# Patient Record
Sex: Female | Born: 1970
Health system: Southern US, Community
[De-identification: ages and names within clinical notes are randomized; demographics above are authoritative.]

## PROBLEM LIST (undated history)

## (undated) DIAGNOSIS — G4700 Insomnia, unspecified: Secondary | ICD-10-CM

## (undated) HISTORY — PX: LEG SURGERY: SHX1003

## (undated) HISTORY — PX: ABDOMINAL HYSTERECTOMY: SHX81

## (undated) HISTORY — DX: Insomnia, unspecified: G47.00

---

## 2008-08-31 HISTORY — PX: BREAST EXCISIONAL BIOPSY: SUR124

## 2009-05-21 ENCOUNTER — Ambulatory Visit: Payer: Self-pay

## 2009-05-23 ENCOUNTER — Ambulatory Visit: Payer: Self-pay

## 2009-06-20 ENCOUNTER — Ambulatory Visit: Payer: Self-pay | Admitting: General Surgery

## 2009-07-11 ENCOUNTER — Ambulatory Visit: Payer: Self-pay | Admitting: General Surgery

## 2010-01-08 ENCOUNTER — Ambulatory Visit: Payer: Self-pay | Admitting: General Surgery

## 2010-07-28 ENCOUNTER — Ambulatory Visit: Payer: Self-pay

## 2011-08-06 ENCOUNTER — Ambulatory Visit: Payer: Self-pay

## 2012-08-16 ENCOUNTER — Ambulatory Visit: Payer: Self-pay

## 2012-08-17 ENCOUNTER — Ambulatory Visit: Payer: Self-pay

## 2013-02-16 ENCOUNTER — Ambulatory Visit: Payer: Self-pay

## 2013-08-14 ENCOUNTER — Ambulatory Visit: Payer: Self-pay | Admitting: Obstetrics and Gynecology

## 2013-08-14 LAB — CBC
HCT: 43.1 % (ref 35.0–47.0)
HGB: 14.3 g/dL (ref 12.0–16.0)
MCH: 28.4 pg (ref 26.0–34.0)
MCHC: 33.3 g/dL (ref 32.0–36.0)
Platelet: 220 10*3/uL (ref 150–440)
RDW: 12.6 % (ref 11.5–14.5)
WBC: 6.8 10*3/uL (ref 3.6–11.0)

## 2013-08-14 LAB — PREGNANCY, URINE: Pregnancy Test, Urine: NEGATIVE m[IU]/mL

## 2013-08-25 ENCOUNTER — Ambulatory Visit: Payer: Self-pay | Admitting: Obstetrics and Gynecology

## 2013-08-28 LAB — PATHOLOGY REPORT

## 2013-09-25 ENCOUNTER — Ambulatory Visit: Payer: Self-pay | Admitting: Obstetrics and Gynecology

## 2013-09-25 LAB — CBC
HCT: 40.2 % (ref 35.0–47.0)
HGB: 13.5 g/dL (ref 12.0–16.0)
MCH: 28.7 pg (ref 26.0–34.0)
MCHC: 33.5 g/dL (ref 32.0–36.0)
MCV: 86 fL (ref 80–100)
Platelet: 209 10*3/uL (ref 150–440)
RBC: 4.69 10*6/uL (ref 3.80–5.20)
RDW: 13 % (ref 11.5–14.5)
WBC: 7.3 10*3/uL (ref 3.6–11.0)

## 2013-09-25 LAB — BASIC METABOLIC PANEL
Anion Gap: 3 — ABNORMAL LOW (ref 7–16)
BUN: 12 mg/dL (ref 7–18)
CALCIUM: 9.3 mg/dL (ref 8.5–10.1)
Chloride: 102 mmol/L (ref 98–107)
Co2: 29 mmol/L (ref 21–32)
Creatinine: 0.6 mg/dL (ref 0.60–1.30)
EGFR (African American): >60
EGFR (Non-African Amer.): >60
GLUCOSE: 86 mg/dL (ref 65–99)
Osmolality: 267 (ref 275–301)
Potassium: 4.1 mmol/L (ref 3.5–5.1)
SODIUM: 134 mmol/L — AB (ref 136–145)

## 2013-10-05 ENCOUNTER — Ambulatory Visit: Payer: Self-pay | Admitting: Obstetrics and Gynecology

## 2013-10-06 LAB — CBC WITH DIFFERENTIAL/PLATELET
BASOS ABS: 0 10*3/uL (ref 0.0–0.1)
BASOS PCT: 0.3 %
Eosinophil #: 0 10*3/uL (ref 0.0–0.7)
Eosinophil %: 0 %
HCT: 34 % — ABNORMAL LOW (ref 35.0–47.0)
HGB: 11.7 g/dL — ABNORMAL LOW (ref 12.0–16.0)
LYMPHS ABS: 1.7 10*3/uL (ref 1.0–3.6)
Lymphocyte %: 15.5 %
MCH: 29.5 pg (ref 26.0–34.0)
MCHC: 34.4 g/dL (ref 32.0–36.0)
MCV: 86 fL (ref 80–100)
MONO ABS: 0.7 x10 3/mm (ref 0.2–0.9)
MONOS PCT: 6.6 %
NEUTROS ABS: 8.8 10*3/uL — AB (ref 1.4–6.5)
Neutrophil %: 77.6 %
PLATELETS: 238 10*3/uL (ref 150–440)
RBC: 3.96 10*6/uL (ref 3.80–5.20)
RDW: 12.9 % (ref 11.5–14.5)
WBC: 11.3 10*3/uL — ABNORMAL HIGH (ref 3.6–11.0)

## 2013-10-06 LAB — BASIC METABOLIC PANEL
Anion Gap: 3 — ABNORMAL LOW (ref 7–16)
BUN: 8 mg/dL (ref 7–18)
CO2: 28 mmol/L (ref 21–32)
Calcium, Total: 7.9 mg/dL — ABNORMAL LOW (ref 8.5–10.1)
Chloride: 107 mmol/L (ref 98–107)
Creatinine: 0.76 mg/dL (ref 0.60–1.30)
EGFR (African American): >60
GLUCOSE: 141 mg/dL — AB (ref 65–99)
Osmolality: 276 (ref 275–301)
Potassium: 4 mmol/L (ref 3.5–5.1)
SODIUM: 138 mmol/L (ref 136–145)

## 2013-10-09 LAB — PATHOLOGY REPORT

## 2014-06-21 ENCOUNTER — Ambulatory Visit: Payer: Self-pay

## 2014-12-22 NOTE — Op Note (Signed)
PATIENT NAME:  Danielle Greene, Danielle Greene MR#:  [id number] DATE OF BIRTH:  1971/08/23  DATE OF PROCEDURE:  10/05/2013  PREOPERATIVE DIAGNOSIS: Menorrhagia, leiomyomata.   POSTOPERATIVE DIAGNOSIS: Menorrhagia, leiomyomata.   OPERATION PERFORMED: Total laparoscopic hysterectomy, bilateral salpingectomy and cystoscopy.   SURGEON: Vena Austria, MD  ASSISTANT: Adria Devon, MD  ANESTHESIA USED: General.  ESTIMATED BLOOD LOSS: 50 mL.  OPERATIVE FLUIDS: 1200 mL.   URINE OUTPUT: 300 mL.  PREOPERATIVE ANTIBIOTICS: 2 grams of Ancef.   DRAINS OR TUBES: Foley to gravity drainage.   IMPLANTS: None.   COMPLICATIONS: None.   INTRAOPERATIVE FINDINGS: Irregular shaped globular uterus with multiple fibroids. Normal ovaries bilateral. Right ovary somewhat adhesed to the right pelvic sidewall. Normal fallopian tubes. Otherwise normal pelvic anatomy. Bilateral ureters were visualized during the case on cystoscopy. 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 The bladder was without defect.   SPECIMENS REMOVED: Uterus and cervix, right and left tube.   PATIENT CONDITION FOLLOWING PROCEDURE: Stable.   PROCEDURE IN DETAIL: Risks, benefits, and alternatives of the procedure were discussed with the patient prior to proceeding to the operating room. The patient was taken to the operating room where she was placed under general endotracheal anesthesia. She was positioned in the dorsal lithotomy position using Allen stirrups, prepped and draped in the usual sterile fashion. A timeout procedure was then performed. Attention was turned to the patient's pelvis. A Foley catheter was placed to allow drainage of the bladder. An operative speculum was then placed in the patient's vagina to visualize the cervix. The anterior lip of the cervix was grasped with a single-tooth tenaculum and a large V-Care device was  placed to allow for uterine manipulation. Following placement of the V-Care device, the single-tooth tenaculum was removed as was the operative speculum. Attention was then turned to the patient's abdomen. The umbilicus was infiltrated with 1% lidocaine. A stab incision was made at the base of the umbilicus and entry into the abdomen was accomplished using a 5 mm XL trocar under direct visualization. Once pneumoperitoneum had been established, two 5 mm lateral assistant ports, 1 right and 1 left, were placed under direct visualization. Survey of the pelvis noted the above findings. Attention was turned to the patient's left tube. This was dissected off its attachments to the ovary and mesosalpinx using a 5 mm Harmonic device. Following this the utero-ovarian pedicle was transected using the 5 mm Harmonic followed by the round. Once the round ligament had been transected, the anterior leaf of the broad was dissected down to the level of the internal cervical os. The posterior leaf of the broad ligament was then dissected down to the level of the left uterosacral. The left uterine artery was skeletonized and ligated using a Kleppinger bipolar device and transected using the 5 mm LigaSure. Following this attention was turned to the patient's left adnexal structures. The tube was dissected off its attachments to the mesosalpinx in a similar fashion. The utero-ovarian and round ligaments were also transected using the 5 mm LigaSure device. The anterior leaf and posterior leaf of the broad were likewise skeletonized, and the uterine artery was further skeletonized before using the Kleppinger and Harmonic to dissect the uterine artery on the right. The bladder was noted to be well down away from the V-Care cup. A posterior colpotomy was then made using the 5 mm LigaSure, carried down circumferentially freeing the specimen.  The specimen was removed vaginally, and the vaginal cuff was then closed using a single 0 Vicryl  running stitch. Following closure of the vaginal cuff, no defects were noted in the vaginal cuff. Cystoscopy was performed noting the above findings. A second look was taken from above. All pedicles were noted to be hemostatic. The pelvis was irrigated, and the pneumoperitoneum was evacuated. The 5 mm port sites were closed using Dermabond. Sponge, needle, and instrument counts were correct x2. The patient tolerated the procedure well and was taken to the recovery room in stable condition.   ____________________________ Florina OuAndreas M. Bonney AidStaebler, MD ams:sb D: 10/05/2013 16:21:31 ET T: 10/05/2013 17:17:23 ET JOB#: [id number]  cc: Florina OuAndreas M. Bonney AidStaebler, MD, <Dictator> Carmel SacramentoANDREAS Cathrine MusterM Maribell Demeo MD ELECTRONICALLY SIGNED 10/18/2013 0:59

## 2014-12-22 NOTE — Op Note (Signed)
PATIENT NAME:  Danielle Greene, Danielle Greene MR#:  [id number] DATE OF BIRTH:  [birth year]  DATE OF PROCEDURE:  08/25/2013  PREOPERATIVE DIAGNOSES: Menorrhagia and endometrial polyps.   POSTOPERATIVE DIAGNOSES: Menorrhagia and endometrial polyps including several mucosal leiomyomas.  OPERATION PERFORMED: Hysteroscopy, dilatation and curettage.   ANESTHESIA: General.  SURGEON: Vena AustriaAndreas Nuri Branca, MD.   ESTIMATED BLOOD LOSS: Minimal.  OPERATIVE FLUIDS: 300 mL.  URINE OUTPUT: 100 mL.   PREOPERATIVE ANTIBIOTICS: None.  DRAINS OR TUBES: None.   IMPLANTS: None.  FINDINGS: Several small submucosal fibroids, 1 small polyp in the uterine fundus, otherwise normal intracavitary anatomy and cervix.   SPECIMENS REMOVED: Endometrial curettings.   CONDITION FOLLOWING PROCEDURE: Stable.   PROCEDURE IN DETAIL: Risks, benefits and alternatives of the procedure were discussed with the patient prior to proceeding to the operating room. The patient was taken to the operating room where she was placed under general endotracheal anesthesia. The patient was positioned in the dorsal lithotomy position utilizing Allen stirrups. She was prepped and draped in the usual sterile fashion. A timeout procedure was performed. Attention was turned to the patient's pelvis and the operative speculum was placed. The anterior lip of the cervix was grasped with a single-tooth tenaculum and was sequentially dilated using Pratt dilators. The hysteroscope was then advanced noting the above findings. Sharp curettage was performed and following the curettage a second look hysteroscopy confirmed removal of the polyp. The hysteroscope was removed as was the single-tooth tenaculum and operative speculum. Sponge, needle and instrument counts were correct x 2. The patient tolerated the procedure well and was taken to the recovery room in stable condition.   ____________________________ Florina OuAndreas M. Bonney AidStaebler, MD ams:aw D: 08/27/2013 22:24:04  ET T: 08/28/2013 06:36:08 ET JOB#: [id number]  cc: Florina OuAndreas M. Bonney AidStaebler, MD, <Dictator> Carmel SacramentoANDREAS Cathrine MusterM [place] MD ELECTRONICALLY SIGNED 09/21/2013 8:51

## 2015-06-20 ENCOUNTER — Other Ambulatory Visit: Payer: Self-pay | Admitting: Obstetrics and Gynecology

## 2015-06-20 DIAGNOSIS — Z1231 Encounter for screening mammogram for malignant neoplasm of breast: Secondary | ICD-10-CM

## 2015-06-24 ENCOUNTER — Ambulatory Visit
Admission: RE | Admit: 2015-06-24 | Discharge: 2015-06-24 | Disposition: A | Discharge status: Home / Self Care | Payer: BLUE CROSS/BLUE SHIELD | Source: Ambulatory Visit | Attending: Obstetrics and Gynecology | Admitting: Obstetrics and Gynecology

## 2015-06-24 DIAGNOSIS — Z1231 Encounter for screening mammogram for malignant neoplasm of breast: Secondary | ICD-10-CM

## 2015-12-16 DIAGNOSIS — M546 Pain in thoracic spine: Secondary | ICD-10-CM | POA: Diagnosis not present

## 2015-12-16 DIAGNOSIS — M9902 Segmental and somatic dysfunction of thoracic region: Secondary | ICD-10-CM | POA: Diagnosis not present

## 2015-12-30 DIAGNOSIS — M9907 Segmental and somatic dysfunction of upper extremity: Secondary | ICD-10-CM | POA: Diagnosis not present

## 2015-12-30 DIAGNOSIS — M546 Pain in thoracic spine: Secondary | ICD-10-CM | POA: Diagnosis not present

## 2015-12-30 DIAGNOSIS — M9903 Segmental and somatic dysfunction of lumbar region: Secondary | ICD-10-CM | POA: Diagnosis not present

## 2015-12-30 DIAGNOSIS — M9902 Segmental and somatic dysfunction of thoracic region: Secondary | ICD-10-CM | POA: Diagnosis not present

## 2016-01-14 DIAGNOSIS — M9903 Segmental and somatic dysfunction of lumbar region: Secondary | ICD-10-CM | POA: Diagnosis not present

## 2016-01-14 DIAGNOSIS — M9907 Segmental and somatic dysfunction of upper extremity: Secondary | ICD-10-CM | POA: Diagnosis not present

## 2016-01-14 DIAGNOSIS — M9902 Segmental and somatic dysfunction of thoracic region: Secondary | ICD-10-CM | POA: Diagnosis not present

## 2016-01-14 DIAGNOSIS — M546 Pain in thoracic spine: Secondary | ICD-10-CM | POA: Diagnosis not present

## 2016-03-04 DIAGNOSIS — Z1283 Encounter for screening for malignant neoplasm of skin: Secondary | ICD-10-CM | POA: Diagnosis not present

## 2016-03-04 DIAGNOSIS — Z872 Personal history of diseases of the skin and subcutaneous tissue: Secondary | ICD-10-CM | POA: Diagnosis not present

## 2016-03-04 DIAGNOSIS — L301 Dyshidrosis [pompholyx]: Secondary | ICD-10-CM | POA: Diagnosis not present

## 2016-03-30 DIAGNOSIS — M9903 Segmental and somatic dysfunction of lumbar region: Secondary | ICD-10-CM | POA: Diagnosis not present

## 2016-03-30 DIAGNOSIS — M546 Pain in thoracic spine: Secondary | ICD-10-CM | POA: Diagnosis not present

## 2016-03-30 DIAGNOSIS — M9902 Segmental and somatic dysfunction of thoracic region: Secondary | ICD-10-CM | POA: Diagnosis not present

## 2016-06-03 ENCOUNTER — Other Ambulatory Visit: Payer: Self-pay | Admitting: Obstetrics and Gynecology

## 2016-06-03 DIAGNOSIS — Z1231 Encounter for screening mammogram for malignant neoplasm of breast: Secondary | ICD-10-CM

## 2016-06-04 DIAGNOSIS — Z23 Encounter for immunization: Secondary | ICD-10-CM | POA: Diagnosis not present

## 2016-06-30 ENCOUNTER — Ambulatory Visit
Admission: RE | Admit: 2016-06-30 | Discharge: 2016-06-30 | Disposition: A | Discharge status: Home / Self Care | Payer: BLUE CROSS/BLUE SHIELD | Source: Ambulatory Visit | Attending: Obstetrics and Gynecology | Admitting: Obstetrics and Gynecology

## 2016-06-30 ENCOUNTER — Other Ambulatory Visit: Payer: Self-pay | Admitting: Obstetrics and Gynecology

## 2016-06-30 DIAGNOSIS — Z1231 Encounter for screening mammogram for malignant neoplasm of breast: Secondary | ICD-10-CM | POA: Diagnosis not present

## 2016-08-14 ENCOUNTER — Other Ambulatory Visit: Payer: Self-pay | Admitting: Obstetrics and Gynecology

## 2016-08-14 ENCOUNTER — Ambulatory Visit (INDEPENDENT_AMBULATORY_CARE_PROVIDER_SITE_OTHER): Payer: BLUE CROSS/BLUE SHIELD | Admitting: Obstetrics and Gynecology

## 2016-08-14 ENCOUNTER — Encounter: Payer: Self-pay | Admitting: Obstetrics and Gynecology

## 2016-08-14 VITALS — BP 128/67 | HR 56 | Ht 63.0 in | Wt 151.2 lb

## 2016-08-14 DIAGNOSIS — Z23 Encounter for immunization: Secondary | ICD-10-CM | POA: Diagnosis not present

## 2016-08-14 DIAGNOSIS — Z01419 Encounter for gynecological examination (general) (routine) without abnormal findings: Secondary | ICD-10-CM

## 2016-08-14 MED ORDER — ZOLPIDEM TARTRATE 10 MG PO TABS: 10.0000 mg | ORAL_TABLET | Freq: Every evening | ORAL | 2 refills | Status: DC | PRN

## 2016-08-14 MED ORDER — TETANUS-DIPHTH-ACELL PERTUSSIS 5-2.5-18.5 LF-MCG/0.5 IM SUSP
0.5000 mL | Freq: Once | INTRAMUSCULAR | Status: AC
  Administered 2016-08-14: 0.5 mL via INTRAMUSCULAR

## 2016-08-14 NOTE — Progress Notes (Signed)
Subjective:   Shravya L Aurea GraffStraughan is a 45 y.o. G1P0 Caucasian female here for a routine well-woman exam.  Patient's last menstrual period was 10/24/2013.    Current complaints: none PCP: none       does desire labs  Social History: Sexual: heterosexual Marital Status: married Living situation: with family Occupation: [employer] Tobacco/alcohol: no tobacco use Illicit drugs: no history of illicit drug use  The following portions of the patient's history were reviewed and updated as appropriate: allergies, current medications, past family history, past medical history, past social history, past surgical history and problem list.  Past Medical History Past Medical History:  Diagnosis Date  . Insomnia     Past Surgical History Past Surgical History:  Procedure Laterality Date  . BREAST BIOPSY Right 2010   neg    Gynecologic History G1P0  Patient's last menstrual period was 10/24/2013. Contraception: status post hysterectomy Last Pap: 2016. Results were: normal Last mammogram: 2017. Results were: normal   Obstetric History OB History  Gravida Para Term Preterm AB Living  1            SAB TAB Ectopic Multiple Live Births               # Outcome Date GA Lbr Len/2nd Weight Sex Delivery Anes PTL Lv  1 Gravida 2000    M Vag-Spont         Current Medications No current outpatient prescriptions on file prior to visit.   No current facility-administered medications on file prior to visit.     Review of Systems Patient denies any headaches, blurred vision, shortness of breath, chest pain, abdominal pain, problems with bowel movements, urination, or intercourse.  Objective:  BP 128/67   Pulse (!) 56   Ht 5\' 3"  (1.6 m)   Wt 151 lb 3.2 oz (68.6 kg)   LMP 10/24/2013   BMI 26.78 kg/m  Physical Exam  General:  Well developed, well nourished, no acute distress. She is alert and oriented x3. Skin:  Warm and dry Neck:  Midline trachea, no thyromegaly or  nodules Cardiovascular: Regular rate and rhythm, no murmur heard Lungs:  Effort normal, all lung fields clear to auscultation bilaterally Breasts:  No dominant palpable mass, retraction, or nipple discharge Abdomen:  Soft, non tender, no hepatosplenomegaly or masses Pelvic:  External genitalia is normal in appearance.  The vagina is normal in appearance. The cervix is bulbous, no CMT.  Thin prep pap is done with HR HPV cotesting. Uterus is felt to be normal size, shape, and contour.  No adnexal masses or tenderness noted. Extremities:  No swelling or varicosities noted Psych:  She has a normal mood and affect  Assessment:   Healthy well-woman exam Sleep disturbance Needs TDAP  Plan:  Labs obtained, Tdap given F/U 1 year for AE, or sooner if needed Mammogram done this year  Cora Stetson Suzan NailerN Geneieve Duell, CNM

## 2016-08-14 NOTE — Patient Instructions (Signed)
 Preventive Care 18-39 Years, Female Preventive care refers to lifestyle choices and visits with your health care provider that can promote health and wellness. What does preventive care include?  A yearly physical exam. This is also called an annual well check.  Dental exams once or twice a year.  Routine eye exams. Ask your health care provider how often you should have your eyes checked.  Personal lifestyle choices, including:  Daily care of your teeth and gums.  Regular physical activity.  Eating a healthy diet.  Avoiding tobacco and drug use.  Limiting alcohol use.  Practicing safe sex.  Taking vitamin and mineral supplements as recommended by your health care provider. What happens during an annual well check? The services and screenings done by your health care provider during your annual well check will depend on your age, overall health, lifestyle risk factors, and family history of disease. Counseling  Your health care provider may ask you questions about your:  Alcohol use.  Tobacco use.  Drug use.  Emotional well-being.  Home and relationship well-being.  Sexual activity.  Eating habits.  Work and work environment.  Method of birth control.  Menstrual cycle.  Pregnancy history. Screening  You may have the following tests or measurements:  Height, weight, and BMI.  Diabetes screening. This is done by checking your blood sugar (glucose) after you have not eaten for a while (fasting).  Blood pressure.  Lipid and cholesterol levels. These may be checked every 5 years starting at age 20.  Skin check.  Hepatitis C blood test.  Hepatitis B blood test.  Sexually transmitted disease (STD) testing.  BRCA-related cancer screening. This may be done if you have a family history of breast, ovarian, tubal, or peritoneal cancers.  Pelvic exam and Pap test. This may be done every 3 years starting at age 21. Starting at age 30, this may be done  every 5 years if you have a Pap test in combination with an HPV test. Discuss your test results, treatment options, and if necessary, the need for more tests with your health care provider. Vaccines  Your health care provider may recommend certain vaccines, such as:  Influenza vaccine. This is recommended every year.  Tetanus, diphtheria, and acellular pertussis (Tdap, Td) vaccine. You may need a Td booster every 10 years.  Varicella vaccine. You may need this if you have not been vaccinated.  HPV vaccine. If you are 26 or younger, you may need three doses over 6 months.  Measles, mumps, and rubella (MMR) vaccine. You may need at least one dose of MMR. You may also need a second dose.  Pneumococcal 13-valent conjugate (PCV13) vaccine. You may need this if you have certain conditions and were not previously vaccinated.  Pneumococcal polysaccharide (PPSV23) vaccine. You may need one or two doses if you smoke cigarettes or if you have certain conditions.  Meningococcal vaccine. One dose is recommended if you are age 19-21 years and a first-year college student living in a residence hall, or if you have one of several medical conditions. You may also need additional booster doses.  Hepatitis A vaccine. You may need this if you have certain conditions or if you travel or work in places where you may be exposed to hepatitis A.  Hepatitis B vaccine. You may need this if you have certain conditions or if you travel or work in places where you may be exposed to hepatitis B.  Haemophilus influenzae type b (Hib) vaccine. You may need   this if you have certain risk factors. Talk to your health care provider about which screenings and vaccines you need and how often you need them. This information is not intended to replace advice given to you by your health care provider. Make sure you discuss any questions you have with your health care provider. Document Released: 10/13/2001 Document Revised:  05/06/2016 Document Reviewed: 06/18/2015 Elsevier Interactive Patient Education  2017 Elsevier Inc.  

## 2016-08-15 LAB — COMPREHENSIVE METABOLIC PANEL
ALK PHOS: 36 IU/L — AB (ref 39–117)
ALT: 11 IU/L (ref 0–32)
AST: 15 IU/L (ref 0–40)
Albumin/Globulin Ratio: 1.6 (ref 1.2–2.2)
Albumin: 4.5 g/dL (ref 3.5–5.5)
BILIRUBIN TOTAL: 0.4 mg/dL (ref 0.0–1.2)
BUN/Creatinine Ratio: 21 (ref 9–23)
BUN: 18 mg/dL (ref 6–24)
CHLORIDE: 99 mmol/L (ref 96–106)
CO2: 24 mmol/L (ref 18–29)
CREATININE: 0.85 mg/dL (ref 0.57–1.00)
Calcium: 9.4 mg/dL (ref 8.7–10.2)
GFR calc non Af Amer: 84 mL/min/{1.73_m2} (ref >59)
GFR, EST AFRICAN AMERICAN: 96 mL/min/{1.73_m2} (ref >59)
GLUCOSE: 88 mg/dL (ref 65–99)
Globulin, Total: 2.9 g/dL (ref 1.5–4.5)
Potassium: 4.2 mmol/L (ref 3.5–5.2)
Sodium: 139 mmol/L (ref 134–144)
TOTAL PROTEIN: 7.4 g/dL (ref 6.0–8.5)

## 2016-08-15 LAB — LIPID PANEL
CHOL/HDL RATIO: 3.1 ratio (ref 0.0–4.4)
Cholesterol, Total: 167 mg/dL (ref 100–199)
HDL: 54 mg/dL (ref >39)
LDL Calculated: 96 mg/dL (ref 0–99)
Triglycerides: 85 mg/dL (ref 0–149)
VLDL Cholesterol Cal: 17 mg/dL (ref 5–40)

## 2016-08-15 LAB — VITAMIN D 25 HYDROXY (VIT D DEFICIENCY, FRACTURES): VIT D 25 HYDROXY: 34.9 ng/mL (ref 30.0–100.0)

## 2016-08-17 ENCOUNTER — Other Ambulatory Visit: Payer: Self-pay

## 2016-08-17 LAB — CYTOLOGY - PAP

## 2016-09-24 DIAGNOSIS — M9902 Segmental and somatic dysfunction of thoracic region: Secondary | ICD-10-CM | POA: Diagnosis not present

## 2016-09-24 DIAGNOSIS — M9903 Segmental and somatic dysfunction of lumbar region: Secondary | ICD-10-CM | POA: Diagnosis not present

## 2016-09-24 DIAGNOSIS — M546 Pain in thoracic spine: Secondary | ICD-10-CM | POA: Diagnosis not present

## 2016-10-01 DIAGNOSIS — M9903 Segmental and somatic dysfunction of lumbar region: Secondary | ICD-10-CM | POA: Diagnosis not present

## 2016-10-01 DIAGNOSIS — M546 Pain in thoracic spine: Secondary | ICD-10-CM | POA: Diagnosis not present

## 2016-10-01 DIAGNOSIS — M9902 Segmental and somatic dysfunction of thoracic region: Secondary | ICD-10-CM | POA: Diagnosis not present

## 2016-10-15 DIAGNOSIS — M9903 Segmental and somatic dysfunction of lumbar region: Secondary | ICD-10-CM | POA: Diagnosis not present

## 2016-10-15 DIAGNOSIS — M9902 Segmental and somatic dysfunction of thoracic region: Secondary | ICD-10-CM | POA: Diagnosis not present

## 2016-10-15 DIAGNOSIS — M546 Pain in thoracic spine: Secondary | ICD-10-CM | POA: Diagnosis not present

## 2016-11-05 DIAGNOSIS — M9902 Segmental and somatic dysfunction of thoracic region: Secondary | ICD-10-CM | POA: Diagnosis not present

## 2016-11-05 DIAGNOSIS — M9903 Segmental and somatic dysfunction of lumbar region: Secondary | ICD-10-CM | POA: Diagnosis not present

## 2016-11-05 DIAGNOSIS — M546 Pain in thoracic spine: Secondary | ICD-10-CM | POA: Diagnosis not present

## 2016-11-20 ENCOUNTER — Encounter: Payer: Self-pay | Admitting: Obstetrics and Gynecology

## 2016-12-18 DIAGNOSIS — M9902 Segmental and somatic dysfunction of thoracic region: Secondary | ICD-10-CM | POA: Diagnosis not present

## 2016-12-18 DIAGNOSIS — M9903 Segmental and somatic dysfunction of lumbar region: Secondary | ICD-10-CM | POA: Diagnosis not present

## 2016-12-18 DIAGNOSIS — M546 Pain in thoracic spine: Secondary | ICD-10-CM | POA: Diagnosis not present

## 2017-01-15 DIAGNOSIS — M9902 Segmental and somatic dysfunction of thoracic region: Secondary | ICD-10-CM | POA: Diagnosis not present

## 2017-01-15 DIAGNOSIS — M9903 Segmental and somatic dysfunction of lumbar region: Secondary | ICD-10-CM | POA: Diagnosis not present

## 2017-02-04 ENCOUNTER — Ambulatory Visit (INDEPENDENT_AMBULATORY_CARE_PROVIDER_SITE_OTHER): Payer: BLUE CROSS/BLUE SHIELD | Admitting: Obstetrics and Gynecology

## 2017-02-04 ENCOUNTER — Encounter: Payer: Self-pay | Admitting: Obstetrics and Gynecology

## 2017-02-04 VITALS — BP 124/58 | HR 56 | Ht 63.0 in | Wt 136.8 lb

## 2017-02-04 DIAGNOSIS — N393 Stress incontinence (female) (male): Secondary | ICD-10-CM

## 2017-02-04 NOTE — Patient Instructions (Addendum)
Urinary Incontinence Urinary incontinence is the involuntary loss of urine from your bladder. What are the causes? There are many causes of urinary incontinence. They include:  Medicines.  Infections.  Prostatic enlargement, leading to overflow of urine from your bladder.  Surgery.  Neurological diseases.  Emotional factors.  What are the signs or symptoms? Urinary Incontinence can be divided into four types: 1. Urge incontinence. Urge incontinence is the involuntary loss of urine before you have the opportunity to go to the bathroom. There is a sudden urge to void but not enough time to reach a bathroom. 2. Stress incontinence. Stress incontinence is the sudden loss of urine with any activity that forces urine to pass. It is commonly caused by anatomical changes to the pelvis and sphincter areas of your body. 3. Overflow incontinence. Overflow incontinence is the loss of urine from an obstructed opening to your bladder. This results in a backup of urine and a resultant buildup of pressure within the bladder. When the pressure within the bladder exceeds the closing pressure of the sphincter, the urine overflows, which causes incontinence, similar to water overflowing a dam. 4. Total incontinence. Total incontinence is the loss of urine as a result of the inability to store urine within your bladder.  How is this diagnosed? Evaluating the cause of incontinence may require:  A thorough and complete medical and obstetric history.  A complete physical exam.  Laboratory tests such as a urine culture and sensitivities.  When additional tests are indicated, they can include:  An ultrasound exam.  Kidney and bladder X-rays.  Cystoscopy. This is an exam of the bladder using a narrow scope.  Urodynamic testing to test the nerve function to the bladder and sphincter areas.  How is this treated? Treatment for urinary incontinence depends on the cause:  For urge incontinence caused  by a bacterial infection, antibiotics will be prescribed. If the urge incontinence is related to medicines you take, your health care provider may have you change the medicine.  For stress incontinence, surgery to re-establish anatomical support to the bladder or sphincter, or both, will often correct the condition.  For overflow incontinence caused by an enlarged prostate, an operation to open the channel through the enlarged prostate will allow the flow of urine out of the bladder. In women with fibroids, a hysterectomy may be recommended.  For total incontinence, surgery on your urinary sphincter may help. An artificial urinary sphincter (an inflatable cuff placed around the urethra) may be required. In women who have developed a hole-like passage between their bladder and vagina (vesicovaginal fistula), surgery to close the fistula often is required.  Follow these instructions at home:  Normal daily hygiene and the use of pads or adult diapers that are changed regularly will help prevent odors and skin damage.  Avoid caffeine. It can overstimulate your bladder.  Use the bathroom regularly. Try about every 2-3 hours to go to the bathroom, even if you do not feel the need to do so. Take time to empty your bladder completely. After urinating, wait a minute. Then try to urinate again.  For causes involving nerve dysfunction, keep a log of the medicines you take and a journal of the times you go to the bathroom. Contact a health care provider if:  You experience worsening of pain instead of improvement in pain after your procedure.  Your incontinence becomes worse instead of better. Get help right away if:  You experience fever or shaking chills.  You are unable to   pass your urine.  You have redness spreading into your groin or down into your thighs. This information is not intended to replace advice given to you by your health care provider. Make sure you discuss any questions you have  with your health care provider. Document Released: 09/24/2004 Document Revised: 03/27/2016 Document Reviewed: 01/24/2013 Elsevier Interactive Patient Education  2018 Elsevier Inc.  

## 2017-02-04 NOTE — Progress Notes (Signed)
     Subjective:     Danielle Greene is a 46 y.o. female who was referred by Harlow MaresMelody Shambley, CNM for evaluation of urinary incontinence and pessary consultation. This has been present for ~ 1 year and is progressively worsening. She leaks urine with exercise. Patient describes the symptoms as urine leakage with coughing/heavy physical activity. Factors associated with symptoms include: none known. Evaluation to date includes none. Treatment to date includes none. Has tried the OTC Poise Impressa vaginal inserts with no success.    Past Medical History:  Diagnosis Date  . Insomnia     Family History  Problem Relation Age of Onset  . Dementia Mother   . Breast cancer Neg Hx   . Ovarian cancer Neg Hx     Past Surgical History:  Procedure Laterality Date  . BREAST BIOPSY Right 2010   neg    Social History   Social History  . Marital status: Married    Spouse name: N/A  . Number of children: N/A  . Years of education: N/A   Occupational History  . Not on file.   Social History Main Topics  . Smoking status: Never Smoker  . Smokeless tobacco: Never Used  . Alcohol use Yes  . Drug use: No  . Sexual activity: Yes    Birth control/ protection: Surgical   Other Topics Concern  . Not on file   Social History Narrative  . No narrative on file    No Known Allergies   Review of Systems Pertinent items noted in HPI and remainder of comprehensive ROS otherwise negative.    Objective:    BP (!) 124/58 (BP Location: Left Arm, Patient Position: Sitting, Cuff Size: Normal)   Pulse (!) 56   Ht 5\' 3"  (1.6 m)   Wt 136 lb 12.8 oz (62.1 kg)   LMP 10/24/2013 Comment: Partial hysterectomy   BMI 24.23 kg/m  General appearance: alert and no distress Abdomen: soft, non-tender; bowel sounds normal; no masses,  no organomegaly Pelvic: external genitalia normal, rectovaginal septum normal.  Vagina without discharge.  No cystocele present, Grade 1 rectocele.  No leakage of  urine on cough or Valsalva. Cervix normal appearing, no lesions and no motion tenderness.  Uterus mobile, nontender, normal shape and size.  Adnexae non-palpable, nontender bilaterally.  Extremities: extremities normal, atraumatic, no cyanosis or edema Neurologic: Grossly normal    Assessment:   Stress urinary incontinence (exercise induced)  Plan:    The causes of incontinence and plan for evaluation and treatment were discussed. Appropriate educational materials were distributed. Discussed Kegel exercised in detail. Discussed peassary use.  Pessary fitting today.  Will order size 2 incontinence dish with out support   To return in 2 weeks for insertion.    Danielle Greene, Danielle Vavrek, MD [facility]

## 2017-02-09 ENCOUNTER — Other Ambulatory Visit: Payer: Self-pay | Admitting: *Deleted

## 2017-02-09 MED ORDER — ZOLPIDEM TARTRATE 10 MG PO TABS: 10.0000 mg | ORAL_TABLET | Freq: Every evening | ORAL | 2 refills | Status: DC | PRN

## 2017-02-18 ENCOUNTER — Telehealth: Payer: Self-pay | Admitting: Obstetrics and Gynecology

## 2017-02-18 NOTE — Telephone Encounter (Signed)
Patient called to see if the pessary has come in for her yet  Please call with update

## 2017-02-22 NOTE — Telephone Encounter (Signed)
Called pt informed her that pessary was in, had pt scheduled for 02/23/2017 at 1:45pm.

## 2017-02-23 ENCOUNTER — Ambulatory Visit (INDEPENDENT_AMBULATORY_CARE_PROVIDER_SITE_OTHER): Payer: BLUE CROSS/BLUE SHIELD | Admitting: Obstetrics and Gynecology

## 2017-02-23 VITALS — BP 149/66 | HR 47 | Ht 63.0 in | Wt 137.1 lb

## 2017-02-23 DIAGNOSIS — R1032 Left lower quadrant pain: Secondary | ICD-10-CM | POA: Diagnosis not present

## 2017-02-23 DIAGNOSIS — N393 Stress incontinence (female) (male): Secondary | ICD-10-CM

## 2017-02-23 NOTE — Progress Notes (Signed)
    GYNECOLOGY PROGRESS NOTE  Subjective:    Patient ID: Danielle Greene, female    DOB: 10-23-1970, 46 y.o.   MRN: [id number]  HPI  Patient is a 46 y.o. G1P0 female who presents for pessary insertion for mild stress incontinenence (mostly occurring during exercise or other vigorous activity).   The following portions of the patient's history were reviewed and updated as appropriate: allergies, current medications, past family history, past medical history, past social history, past surgical history and problem list.  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 Notes she is concerned and wants to make sure it is not a blood clot.  Has been using Ibuprofen and ice packs as needed.   Objective:   Last menstrual period 10/24/2013. General appearance: alert and no distress Pelvic: external genitalia normal, rectovaginal septum normal.  Vagina without discharge.  No cystocele present, Grade 1 rectocele.  No leakage of urine on cough or Valsalva.  Bimanual exam not performed.  Extremities: extremities normal, atraumatic, no cyanosis or edema.  Normal pedal and femoral pulses Neurologic: Grossly normal    Assessment:   Stress urinary incontinence (exercise induced) Groin pain  Plan:   Size 2 incontinence dish without support placed today.  Patient will use during days of exercising, and will self-remove when not needed. Patient to f/u in 2 weeks for re-evaluation of symptoms.  Advised on Trimo-San gel with insertion of pessary.  Reassured patient that she did not have a blood clot as pulses normal, no evidence of tenderness, or unilateral edema.   Hildred Laserherry, Rubyann Lingle, MD [facility]

## 2017-03-09 ENCOUNTER — Encounter: Payer: Self-pay | Admitting: Obstetrics and Gynecology

## 2017-03-09 ENCOUNTER — Ambulatory Visit (INDEPENDENT_AMBULATORY_CARE_PROVIDER_SITE_OTHER): Payer: BLUE CROSS/BLUE SHIELD | Admitting: Obstetrics and Gynecology

## 2017-03-09 VITALS — BP 111/53 | HR 53 | Ht 63.0 in | Wt 136.3 lb

## 2017-03-09 DIAGNOSIS — N393 Stress incontinence (female) (male): Secondary | ICD-10-CM

## 2017-03-09 NOTE — Progress Notes (Signed)
    GYNECOLOGY PROGRESS NOTE  Subjective:    Patient ID: Danielle Greene, female    DOB: [birth year], 46 y.o.   MRN: [id number]  HPI  Patient is a 46 y.o. G1P0 female who presents for 2 week f/u of pessary check. Patient notes that she has seen some moderate improvement with her stress incontinence while exercising. Is still having some occasional small amounts of leakage, requiring a pantyliner. Has no difficulty inserting and removing pessary as needed.   The following portions of the patient's history were reviewed and updated as appropriate: allergies, current medications, past family history, past medical history, past social history, past surgical history and problem list.  Review of Systems Pertinent items noted in HPI and remainder of comprehensive ROS otherwise negative.   Objective:   Blood pressure (!) 111/53, pulse (!) 53, height 5\' 3"  (1.6 m), weight 136 lb 4.8 oz (61.8 kg), last menstrual period 10/24/2013. General appearance: alert and no distress Remainder of exam deferred   Assessment:   Stress incontinence (with exercise)  Plan:   Patient has been using the pessary now for ~ 1 week.  Notes moderate improvement. Advised to also continue with Kegel exercises.  Will reassess symptoms in 3-4 months.     Hildred Laserherry, Pinchus Weckwerth, MD [facility]

## 2017-03-10 DIAGNOSIS — Z86018 Personal history of other benign neoplasm: Secondary | ICD-10-CM | POA: Diagnosis not present

## 2017-03-10 DIAGNOSIS — D229 Melanocytic nevi, unspecified: Secondary | ICD-10-CM | POA: Diagnosis not present

## 2017-03-23 DIAGNOSIS — M25852 Other specified joint disorders, left hip: Secondary | ICD-10-CM | POA: Diagnosis not present

## 2017-03-23 DIAGNOSIS — M25552 Pain in left hip: Secondary | ICD-10-CM | POA: Diagnosis not present

## 2017-03-26 DIAGNOSIS — M25552 Pain in left hip: Secondary | ICD-10-CM | POA: Diagnosis not present

## 2017-03-29 DIAGNOSIS — M25552 Pain in left hip: Secondary | ICD-10-CM | POA: Diagnosis not present

## 2017-03-31 DIAGNOSIS — M25552 Pain in left hip: Secondary | ICD-10-CM | POA: Diagnosis not present

## 2017-04-05 DIAGNOSIS — M25552 Pain in left hip: Secondary | ICD-10-CM | POA: Diagnosis not present

## 2017-04-07 DIAGNOSIS — M25552 Pain in left hip: Secondary | ICD-10-CM | POA: Diagnosis not present

## 2017-04-12 DIAGNOSIS — M25552 Pain in left hip: Secondary | ICD-10-CM | POA: Diagnosis not present

## 2017-04-14 DIAGNOSIS — M25552 Pain in left hip: Secondary | ICD-10-CM | POA: Diagnosis not present

## 2017-04-26 DIAGNOSIS — M25552 Pain in left hip: Secondary | ICD-10-CM | POA: Diagnosis not present

## 2017-04-28 DIAGNOSIS — M25552 Pain in left hip: Secondary | ICD-10-CM | POA: Diagnosis not present

## 2017-05-05 DIAGNOSIS — M25552 Pain in left hip: Secondary | ICD-10-CM | POA: Diagnosis not present

## 2017-05-05 DIAGNOSIS — M25852 Other specified joint disorders, left hip: Secondary | ICD-10-CM | POA: Diagnosis not present

## 2017-05-29 DIAGNOSIS — Z23 Encounter for immunization: Secondary | ICD-10-CM | POA: Diagnosis not present

## 2017-05-31 ENCOUNTER — Other Ambulatory Visit: Payer: Self-pay | Admitting: Obstetrics and Gynecology

## 2017-05-31 DIAGNOSIS — Z1231 Encounter for screening mammogram for malignant neoplasm of breast: Secondary | ICD-10-CM

## 2017-07-05 ENCOUNTER — Ambulatory Visit
Admission: RE | Admit: 2017-07-05 | Discharge: 2017-07-05 | Disposition: A | Discharge status: Home / Self Care | Payer: BLUE CROSS/BLUE SHIELD | Source: Ambulatory Visit | Attending: Obstetrics and Gynecology | Admitting: Obstetrics and Gynecology

## 2017-07-05 DIAGNOSIS — Z1231 Encounter for screening mammogram for malignant neoplasm of breast: Secondary | ICD-10-CM | POA: Diagnosis not present

## 2017-07-14 ENCOUNTER — Encounter: Payer: BLUE CROSS/BLUE SHIELD | Admitting: Obstetrics and Gynecology

## 2017-07-14 ENCOUNTER — Encounter: Payer: Self-pay | Admitting: Obstetrics and Gynecology

## 2017-07-14 ENCOUNTER — Ambulatory Visit: Payer: BLUE CROSS/BLUE SHIELD | Admitting: Obstetrics and Gynecology

## 2017-07-14 VITALS — BP 114/63 | HR 63 | Ht 63.0 in | Wt 138.0 lb

## 2017-07-14 DIAGNOSIS — N393 Stress incontinence (female) (male): Secondary | ICD-10-CM | POA: Diagnosis not present

## 2017-07-14 DIAGNOSIS — Z4689 Encounter for fitting and adjustment of other specified devices: Secondary | ICD-10-CM

## 2017-07-14 NOTE — Progress Notes (Signed)
    GYNECOLOGY PROGRESS NOTE  Subjective:    Patient ID: Danielle Greene, female    DOB: July 15, 1971, 46 y.o.   MRN: [id number]  HPI  Patient is a 46 y.o. 461P1001 female who presents for pessary check.  Patient currently with stress incontinence (mostly exercise induced), using Size 2 incontinence dish without support (mostly used with exercise or other strenuous activity days).  Patient notes that initially when she started using the pessary she began to notice modest improvement, however over the past few months she continues to note moderate leakage with exercise, as well as now noticing leakage even when not exercising.  Is now having to wear urinary pads again.  States that the pessary is feeling as though it is coming out of her vagina at times, and has to continuously push it back in after exercising.   Wonders about her other options.   The following portions of the patient's history were reviewed and updated as appropriate: allergies, current medications, past family history, past medical history, past social history, past surgical history and problem list.  Review of Systems Pertinent items noted in HPI and remainder of comprehensive ROS otherwise negative.   Objective:   Blood pressure 114/63, pulse 63, height 5\' 3"  (1.6 m), weight 138 lb (62.6 kg), last menstrual period 10/24/2013. General appearance: alert and no distress Remainder of exam deferred.    Assessment:   Stress urinary incontinence Pessary maintenance  Plan:   - Patient desires to discuss her other options besides pessary use as she notes that it is becoming cumbersome to maintain and is not producing the results that she would like to see.  Discussed that alternative option was surgical, with placement of urethral sling.  Gave a general overview of nature of procedure, recovery time, post-operative expectations.  Advised that I personally do not perform sling operations, however would be happy to refer her to  one of my colleagues in the office that does.  Patient ok with plan, would like to have something done sone (preferably prior to the end of the year for insurance and deductible reasons.  Will refer to Dr. Brennan Baileyavid Evans.  Handout given on incontinence surgery to review.  - Can continue to leave pessary out at this time.   A total of 15 minutes were spent face-to-face with the patient during this encounter and over half of that time dealt with counseling and coordination of care.    Hildred Laserherry, Clerance Umland, MD [facility] 07/14/2017 4:45 PM

## 2017-07-19 ENCOUNTER — Ambulatory Visit (INDEPENDENT_AMBULATORY_CARE_PROVIDER_SITE_OTHER): Payer: BLUE CROSS/BLUE SHIELD | Admitting: Obstetrics and Gynecology

## 2017-07-19 ENCOUNTER — Encounter: Payer: Self-pay | Admitting: Obstetrics and Gynecology

## 2017-07-19 VITALS — BP 132/65 | HR 71 | Ht 63.0 in | Wt 139.1 lb

## 2017-07-19 DIAGNOSIS — N393 Stress incontinence (female) (male): Secondary | ICD-10-CM

## 2017-07-19 NOTE — Progress Notes (Signed)
HPI:      [name] is a 46 y.o. G1P0 who LMP was Patient's last menstrual period was 10/24/2013.  Subjective:   She presents today to discuss possibility of urethral sling procedure for stress incontinence.  She has tried pessary without success.  Patient is very active and works out several times per week.  Reports of urine loss with each workout.  He has to wear large pads and she is very unhappy with this. Previous hysterectomy for heavy bleeding (vaginal) -patient says she has not had bladder or rectal repairs.  Has a number of questions regarding time off of work and time away from exercising if she chooses surgery.      Hx: The following portions of the patient's history were reviewed and updated as appropriate:             She  has a past medical history of Insomnia. She does not have any pertinent problems on file. She  has a past surgical history that includes Breast biopsy (Right, 2010) and Abdominal hysterectomy. Her family history includes Dementia in her mother. She  reports that  has never smoked. she has never used smokeless tobacco. She reports that she drinks alcohol. She reports that she does not use drugs. She has No Known Allergies.       Review of Systems:  Review of Systems  Constitutional: Denied constitutional symptoms, night sweats, recent illness, fatigue, fever, insomnia and weight loss.  Eyes: Denied eye symptoms, eye pain, photophobia, vision change and visual disturbance.  Ears/Nose/Throat/Neck: Denied ear, nose, throat or neck symptoms, hearing loss, nasal discharge, sinus congestion and sore throat.  Cardiovascular: Denied cardiovascular symptoms, arrhythmia, chest pain/pressure, edema, exercise intolerance, orthopnea and palpitations.  Respiratory: Denied pulmonary symptoms, asthma, pleuritic pain, productive sputum, cough, dyspnea and wheezing.  Gastrointestinal: Denied, gastro-esophageal reflux, melena, nausea and vomiting.  Genitourinary:  See HPI for additional information.  Musculoskeletal: Denied musculoskeletal symptoms, stiffness, swelling, muscle weakness and myalgia.  Dermatologic: Denied dermatology symptoms, rash and scar.  Neurologic: Denied neurology symptoms, dizziness, headache, neck pain and syncope.  Psychiatric: Denied psychiatric symptoms, anxiety and depression.  Endocrine: Denied endocrine symptoms including hot flashes and night sweats.   Meds:   Current Outpatient Medications on File Prior to Visit  Medication Sig Dispense Refill  . zolpidem (AMBIEN) 10 MG tablet Take 1 tablet (10 mg total) by mouth at bedtime as needed for sleep. 30 tablet 2   No current facility-administered medications on file prior to visit.     Objective:     Vitals:   07/19/17 1345  BP: 132/65  Pulse: 71                Assessment:    G1P0 Patient Active Problem List   Diagnosis Date Noted  . SUI (stress urinary incontinence, female) 07/14/2017     1. SUI (stress urinary incontinence, female)     Patient has failed pessary and considering sling procedure.   Plan:            1.  Patient to reschedule an appointment if she has further questions or a preop if she has decided upon surgery.  All of her questions were answered and we have discussed sling procedures from workup through surgery and postop course in detail.  All of her questions were answered. Orders No orders of the defined types were placed in this encounter.   No orders of the defined types were placed in this encounter.  F/U  No Follow-up on file. I spent 19 minutes with this patient of which greater than 50% was spent discussing stress urinary incontinence, workup surgery postop course.  Much of this was spent discussing the patient's individual concerns regarding return to work, exercise and success rates of sling.  Elonda Huskyavid J. Justo Hengel, M.D. 07/19/2017 2:58 PM

## 2017-08-03 ENCOUNTER — Encounter: Payer: Self-pay | Admitting: Obstetrics and Gynecology

## 2017-08-03 ENCOUNTER — Ambulatory Visit (INDEPENDENT_AMBULATORY_CARE_PROVIDER_SITE_OTHER): Payer: BLUE CROSS/BLUE SHIELD | Admitting: Obstetrics and Gynecology

## 2017-08-03 VITALS — BP 117/65 | HR 58 | Ht 63.0 in | Wt 140.2 lb

## 2017-08-03 DIAGNOSIS — Z01818 Encounter for other preprocedural examination: Secondary | ICD-10-CM | POA: Diagnosis not present

## 2017-08-03 DIAGNOSIS — N393 Stress incontinence (female) (male): Secondary | ICD-10-CM | POA: Diagnosis not present

## 2017-08-03 NOTE — Progress Notes (Signed)
PRE-OPERATIVE HISTORY AND PHYSICAL EXAM  PCP:  Patient, No Pcp Per Subjective:   HPI:  Danielle Greene is a 46 y.o. [name].  Patient's last menstrual period was 10/24/2013.  She presents today for a pre-op discussion and PE.  She has the following symptoms: She is experiences daily urine leakage especially with exercise.  She has tried a pessary without success and would like definitive management of her urine leakage.  She reports no problems with pelvic prolapse, pelvic pain, or difficulty with intercourse.  She reports that she wears a pad daily and leaks mainly with Valsalva type maneuvers.  She does not empty her bladder completely and does not seem to have any type of urge incontinence.  Review of Systems:   Constitutional: Denied constitutional symptoms, night sweats, recent illness, fatigue, fever, insomnia and weight loss.  Eyes: Denied eye symptoms, eye pain, photophobia, vision change and visual disturbance.  Ears/Nose/Throat/Neck: Denied ear, nose, throat or neck symptoms, hearing loss, nasal discharge, sinus congestion and sore throat.  Cardiovascular: Denied cardiovascular symptoms, arrhythmia, chest pain/pressure, edema, exercise intolerance, orthopnea and palpitations.  Respiratory: Denied pulmonary symptoms, asthma, pleuritic pain, productive sputum, cough, dyspnea and wheezing.  Gastrointestinal: Denied, gastro-esophageal reflux, melena, nausea and vomiting.  Genitourinary: See HPI for additional information.  Musculoskeletal: Denied musculoskeletal symptoms, stiffness, swelling, muscle weakness and myalgia.  Dermatologic: Denied dermatology symptoms, rash and scar.  Neurologic: Denied neurology symptoms, dizziness, headache, neck pain and syncope.  Psychiatric: Denied psychiatric symptoms, anxiety and depression.  Endocrine: Denied endocrine symptoms including hot flashes and night sweats.   OB History  Gravida Para Term Preterm AB Living  1 1 1     1   SAB  TAB Ectopic Multiple Live Births          1    # Outcome Date GA Lbr Len/2nd Weight Sex Delivery Anes PTL Lv  1 Term 2000    M Vag-Spont   LIV      Past Medical History:  Diagnosis Date  . Insomnia     Past Surgical History:  Procedure Laterality Date  . ABDOMINAL HYSTERECTOMY    . BREAST BIOPSY Right 2010   neg      SOCIAL HISTORY: Social History   Tobacco Use  Smoking Status Never Smoker  Smokeless Tobacco Never Used   Social History   Substance and Sexual Activity  Alcohol Use Yes   Social History   Substance and Sexual Activity  Drug Use No    Family History  Problem Relation Age of Onset  . Dementia Mother   . Breast cancer Neg Hx   . Ovarian cancer Neg Hx     ALLERGIES:  Adhesive [tape]  MEDS:   Current Outpatient Medications on File Prior to Visit  Medication Sig Dispense Refill  . zolpidem (AMBIEN) 10 MG tablet Take 1 tablet (10 mg total) by mouth at bedtime as needed for sleep. (Patient taking differently: Take 5 mg by mouth at bedtime. ) 30 tablet 2   No current facility-administered medications on file prior to visit.     No orders of the defined types were placed in this encounter.    Physical examination BP 117/65   Pulse (!) 58   Ht 5\' 3"  (1.6 m)   Wt 140 lb 3 oz (63.6 kg)   LMP 10/24/2013 Comment: Partial hysterectomy   BMI 24.83 kg/m   General NAD, Conversant  HEENT Atraumatic; Op clear with mmm.  Normo-cephalic. Pupils reactive. Anicteric  sclerae  Thyroid/Neck Smooth without nodularity or enlargement. Normal ROM.  Neck Supple.  Skin No rashes, lesions or ulceration. Normal palpated skin turgor. No nodularity.  Breasts: No masses or discharge.  Symmetric.  No axillary adenopathy.  Lungs: Clear to auscultation.No rales or wheezes. Normal Respiratory effort, no retractions.  Heart: NSR.  No murmurs or rubs appreciated. No periferal edema  Abdomen: Soft.  Non-tender.  No masses.  No HSM. No hernia  Extremities: Moves all  appropriately.  Normal ROM for age. No lymphadenopathy.  Neuro: Oriented to PPT.  Normal mood. Normal affect.     Pelvic:   Vulva: Normal appearance.  No lesions.  Vagina: No lesions or abnormalities noted.  Support:  First-degree cystocele/first-degree rectocele.  Urethra No masses tenderness or scarring.  Meatus Normal size without lesions or prolapse.  Cervix: Normal ectropion.  No lesions.  Anus: Normal exam.  No lesions.  Perineum: Normal exam.  No lesions.        Bimanual   Uterus: Normal size.  Non-tender.  Mobile.  AV.  Adnexae: No masses.  Non-tender to palpation.  Cul-de-sac: Negative for abnormality.   Assessment:   [name] Patient Active Problem List   Diagnosis Date Noted  . SUI (stress urinary incontinence, female) 07/14/2017    1. Preop examination   2. SUI (stress urinary incontinence, female)   Patient experiences stress incontinence without significant pelvic prolapse. History of prior hysterectomy.   Plan:   Orders: No orders of the defined types were placed in this encounter.    1.  TOT  Pre-op discussions regarding Risks and Benefits of her scheduled surgery.  TOT I have discussed the procedure of tension free vaginal tape using the trans-obturator approach. (TOT).  I have informed the patient that this procedure often corrects or improves stress urinary incontinence.  For patients without urinary incontinence-this procedure is often performed to lower the risk of iatragenic urinary incontinence at the time of cystocele repair.The patient has been made aware that there is no guarantee of her improvement or the length of time her improvement will last.  The procedure itself was discussed in detail including possible damage to bowel, ureters, urethra and bladder.  I have informed her that the mesh is permanent.  The risk of extrusion of the mesh has also been reviewed.  The management of this complication has been discussed.  The risks of bleeding and  infection were also reviewed.  I have specifically discussed the complication of inability to void following the procedure and the patient is aware that she may go home using a Foley catheter or using a self-catheterization technique.  In addition, I have discussed with the patient the use of cystoscopy to diagnose bladder injury should there be any question of this.    Regarding the polypropylene mesh and mid-urethral slings: I have review the current position statements from [employer]GS 2014 and the AUA.  I have given her copies of both to review.  All of her questions were answered.  She has been advised that should she have any additional questions or concerns regarding her sling procedure we would be happy to schedule a time before her surgery to discuss them. All of the patient's questions have been answered and I believe she has an informed understanding of TOT and cystoscopy.   Elonda Husky, M.D. 08/03/2017 12:08 PM

## 2017-08-03 NOTE — H&P (Signed)
PRE-OPERATIVE HISTORY AND PHYSICAL EXAM  PCP:  Patient, No Pcp Per Subjective:   HPI:  Danielle Greene is a 46 y.o. [name].  Patient's last menstrual period was 10/24/2013.  She presents today for a pre-op discussion and PE.  She has the following symptoms: She is experiences daily urine leakage especially with exercise.  She has tried a pessary without success and would like definitive management of her urine leakage.  She reports no problems with pelvic prolapse, pelvic pain, or difficulty with intercourse.  She reports that she wears a pad daily and leaks mainly with Valsalva type maneuvers.  She does not empty her bladder completely and does not seem to have any type of urge incontinence.  Review of Systems:   Constitutional: Denied constitutional symptoms, night sweats, recent illness, fatigue, fever, insomnia and weight loss.  Eyes: Denied eye symptoms, eye pain, photophobia, vision change and visual disturbance.  Ears/Nose/Throat/Neck: Denied ear, nose, throat or neck symptoms, hearing loss, nasal discharge, sinus congestion and sore throat.  Cardiovascular: Denied cardiovascular symptoms, arrhythmia, chest pain/pressure, edema, exercise intolerance, orthopnea and palpitations.  Respiratory: Denied pulmonary symptoms, asthma, pleuritic pain, productive sputum, cough, dyspnea and wheezing.  Gastrointestinal: Denied, gastro-esophageal reflux, melena, nausea and vomiting.  Genitourinary: See HPI for additional information.  Musculoskeletal: Denied musculoskeletal symptoms, stiffness, swelling, muscle weakness and myalgia.  Dermatologic: Denied dermatology symptoms, rash and scar.  Neurologic: Denied neurology symptoms, dizziness, headache, neck pain and syncope.  Psychiatric: Denied psychiatric symptoms, anxiety and depression.  Endocrine: Denied endocrine symptoms including hot flashes and night sweats.   OB History  Gravida Para Term Preterm AB Living  1 1 1     1   SAB  TAB Ectopic Multiple Live Births          1    # Outcome Date GA Lbr Len/2nd Weight Sex Delivery Anes PTL Lv  1 Term 2000    M Vag-Spont   LIV      Past Medical History:  Diagnosis Date  . Insomnia     Past Surgical History:  Procedure Laterality Date  . ABDOMINAL HYSTERECTOMY    . BREAST BIOPSY Right 2010   neg      SOCIAL HISTORY: Social History   Tobacco Use  Smoking Status Never Smoker  Smokeless Tobacco Never Used   Social History   Substance and Sexual Activity  Alcohol Use Yes   Social History   Substance and Sexual Activity  Drug Use No    Family History  Problem Relation Age of Onset  . Dementia Mother   . Breast cancer Neg Hx   . Ovarian cancer Neg Hx     ALLERGIES:  Adhesive [tape]  MEDS:   Current Outpatient Medications on File Prior to Visit  Medication Sig Dispense Refill  . zolpidem (AMBIEN) 10 MG tablet Take 1 tablet (10 mg total) by mouth at bedtime as needed for sleep. (Patient taking differently: Take 5 mg by mouth at bedtime. ) 30 tablet 2   No current facility-administered medications on file prior to visit.     No orders of the defined types were placed in this encounter.    Physical examination BP 117/65   Pulse (!) 58   Ht 5\' 3"  (1.6 m)   Wt 140 lb 3 oz (63.6 kg)   LMP 10/24/2013 Comment: Partial hysterectomy   BMI 24.83 kg/m   General NAD, Conversant  HEENT Atraumatic; Op clear with mmm.  Normo-cephalic. Pupils reactive. Anicteric  sclerae  Thyroid/Neck Smooth without nodularity or enlargement. Normal ROM.  Neck Supple.  Skin No rashes, lesions or ulceration. Normal palpated skin turgor. No nodularity.  Breasts: No masses or discharge.  Symmetric.  No axillary adenopathy.  Lungs: Clear to auscultation.No rales or wheezes. Normal Respiratory effort, no retractions.  Heart: NSR.  No murmurs or rubs appreciated. No periferal edema  Abdomen: Soft.  Non-tender.  No masses.  No HSM. No hernia  Extremities: Moves all  appropriately.  Normal ROM for age. No lymphadenopathy.  Neuro: Oriented to PPT.  Normal mood. Normal affect.     Pelvic:   Vulva: Normal appearance.  No lesions.  Vagina: No lesions or abnormalities noted.  Support:  First-degree cystocele/first-degree rectocele.  Urethra No masses tenderness or scarring.  Meatus Normal size without lesions or prolapse.  Cervix: Normal ectropion.  No lesions.  Anus: Normal exam.  No lesions.  Perineum: Normal exam.  No lesions.        Bimanual   Uterus: Normal size.  Non-tender.  Mobile.  AV.  Adnexae: No masses.  Non-tender to palpation.  Cul-de-sac: Negative for abnormality.   Assessment:   [name] Patient Active Problem List   Diagnosis Date Noted  . SUI (stress urinary incontinence, female) 07/14/2017    1. Preop examination   2. SUI (stress urinary incontinence, female)   Patient experiences stress incontinence without significant pelvic prolapse. History of prior hysterectomy.   Plan:   Orders: No orders of the defined types were placed in this encounter.    1.  TOT   

## 2017-08-03 NOTE — H&P (View-Only) (Signed)
PRE-OPERATIVE HISTORY AND PHYSICAL EXAM  PCP:  Patient, No Pcp Per Subjective:   HPI:  Danielle Greene is a 46 y.o. [name].  Patient's last menstrual period was 10/24/2013.  She presents today for a pre-op discussion and PE.  She has the following symptoms: She is experiences daily urine leakage especially with exercise.  She has tried a pessary without success and would like definitive management of her urine leakage.  She reports no problems with pelvic prolapse, pelvic pain, or difficulty with intercourse.  She reports that she wears a pad daily and leaks mainly with Valsalva type maneuvers.  She does not empty her bladder completely and does not seem to have any type of urge incontinence.  Review of Systems:   Constitutional: Denied constitutional symptoms, night sweats, recent illness, fatigue, fever, insomnia and weight loss.  Eyes: Denied eye symptoms, eye pain, photophobia, vision change and visual disturbance.  Ears/Nose/Throat/Neck: Denied ear, nose, throat or neck symptoms, hearing loss, nasal discharge, sinus congestion and sore throat.  Cardiovascular: Denied cardiovascular symptoms, arrhythmia, chest pain/pressure, edema, exercise intolerance, orthopnea and palpitations.  Respiratory: Denied pulmonary symptoms, asthma, pleuritic pain, productive sputum, cough, dyspnea and wheezing.  Gastrointestinal: Denied, gastro-esophageal reflux, melena, nausea and vomiting.  Genitourinary: See HPI for additional information.  Musculoskeletal: Denied musculoskeletal symptoms, stiffness, swelling, muscle weakness and myalgia.  Dermatologic: Denied dermatology symptoms, rash and scar.  Neurologic: Denied neurology symptoms, dizziness, headache, neck pain and syncope.  Psychiatric: Denied psychiatric symptoms, anxiety and depression.  Endocrine: Denied endocrine symptoms including hot flashes and night sweats.   OB History  Gravida Para Term Preterm AB Living  1 1 1     1   SAB  TAB Ectopic Multiple Live Births          1    # Outcome Date GA Lbr Len/2nd Weight Sex Delivery Anes PTL Lv  1 Term 2000    M Vag-Spont   LIV      Past Medical History:  Diagnosis Date  . Insomnia     Past Surgical History:  Procedure Laterality Date  . ABDOMINAL HYSTERECTOMY    . BREAST BIOPSY Right 2010   neg      SOCIAL HISTORY: Social History   Tobacco Use  Smoking Status Never Smoker  Smokeless Tobacco Never Used   Social History   Substance and Sexual Activity  Alcohol Use Yes   Social History   Substance and Sexual Activity  Drug Use No    Family History  Problem Relation Age of Onset  . Dementia Mother   . Breast cancer Neg Hx   . Ovarian cancer Neg Hx     ALLERGIES:  Adhesive [tape]  MEDS:   Current Outpatient Medications on File Prior to Visit  Medication Sig Dispense Refill  . zolpidem (AMBIEN) 10 MG tablet Take 1 tablet (10 mg total) by mouth at bedtime as needed for sleep. (Patient taking differently: Take 5 mg by mouth at bedtime. ) 30 tablet 2   No current facility-administered medications on file prior to visit.     No orders of the defined types were placed in this encounter.    Physical examination BP 117/65   Pulse (!) 58   Ht 5\' 3"  (1.6 m)   Wt 140 lb 3 oz (63.6 kg)   LMP 10/24/2013 Comment: Partial hysterectomy   BMI 24.83 kg/m   General NAD, Conversant  HEENT Atraumatic; Op clear with mmm.  Normo-cephalic. Pupils reactive. Anicteric  sclerae  Thyroid/Neck Smooth without nodularity or enlargement. Normal ROM.  Neck Supple.  Skin No rashes, lesions or ulceration. Normal palpated skin turgor. No nodularity.  Breasts: No masses or discharge.  Symmetric.  No axillary adenopathy.  Lungs: Clear to auscultation.No rales or wheezes. Normal Respiratory effort, no retractions.  Heart: NSR.  No murmurs or rubs appreciated. No periferal edema  Abdomen: Soft.  Non-tender.  No masses.  No HSM. No hernia  Extremities: Moves all  appropriately.  Normal ROM for age. No lymphadenopathy.  Neuro: Oriented to PPT.  Normal mood. Normal affect.     Pelvic:   Vulva: Normal appearance.  No lesions.  Vagina: No lesions or abnormalities noted.  Support:  First-degree cystocele/first-degree rectocele.  Urethra No masses tenderness or scarring.  Meatus Normal size without lesions or prolapse.  Cervix: Normal ectropion.  No lesions.  Anus: Normal exam.  No lesions.  Perineum: Normal exam.  No lesions.        Bimanual   Uterus: Normal size.  Non-tender.  Mobile.  AV.  Adnexae: No masses.  Non-tender to palpation.  Cul-de-sac: Negative for abnormality.   Assessment:   [name] Patient Active Problem List   Diagnosis Date Noted  . SUI (stress urinary incontinence, female) 07/14/2017    1. Preop examination   2. SUI (stress urinary incontinence, female)   Patient experiences stress incontinence without significant pelvic prolapse. History of prior hysterectomy.   Plan:   Orders: No orders of the defined types were placed in this encounter.    1.  TOT

## 2017-08-04 ENCOUNTER — Encounter
Admission: RE | Admit: 2017-08-04 | Discharge: 2017-08-04 | Disposition: A | Discharge status: Home / Self Care | Payer: BLUE CROSS/BLUE SHIELD | Source: Ambulatory Visit | Attending: Obstetrics and Gynecology | Admitting: Obstetrics and Gynecology

## 2017-08-04 ENCOUNTER — Other Ambulatory Visit: Payer: Self-pay

## 2017-08-04 ENCOUNTER — Encounter: Payer: Self-pay | Admitting: *Deleted

## 2017-08-04 NOTE — Patient Instructions (Signed)
  Your procedure is scheduled on: 08-09-17 MONDAY Report to Same Day Surgery 2nd floor medical mall [facility](Medical Mall Entrance-take elevator on left to 2nd floor.  Check in with surgery information desk.) To find out your arrival time please call [phone] between 1PM - 3PM on 08-06-17 FRIDAY  Remember: Instructions that are not followed completely may result in serious medical risk, up to and including death, or upon the discretion of your surgeon and anesthesiologist your surgery may need to be rescheduled.    _x___ 1. Do not eat food after midnight the night before your procedure. NO GUM OR CANDY AFTER MIDNIGHT.  You may drink clear liquids up to 2 hours before you are scheduled to arrive at the hospital for your procedure.  Do not drink clear liquids within 2 hours of your scheduled arrival to the hospital.  Clear liquids include  --Water or Apple juice without pulp  --Clear carbohydrate beverage such as ClearFast or Gatorade  --Black Coffee or Clear Tea (No milk, no creamers, do not add anything to the coffee or Tea)      __x__ 2. No Alcohol for 24 hours before or after surgery.   __x__3. No Smoking for 24 prior to surgery.   ____  4. Bring all medications with you on the day of surgery if instructed.    __x__ 5. Notify your doctor if there is any change in your medical condition     (cold, fever, infections).     Do not wear jewelry, make-up, hairpins, clips or nail polish.  Do not wear lotions, powders, or perfumes. You may wear deodorant.  Do not shave 48 hours prior to surgery. Men may shave face and neck.  Do not bring valuables to the hospital.    [facility] is not responsible for any belongings or valuables.               Contacts, dentures or bridgework may not be worn into surgery.  Leave your suitcase in the car. After surgery it may be brought to your room.  For patients admitted to the hospital, discharge time is determined by your treatment team.   Patients  discharged the day of surgery will not be allowed to drive home.  You will need someone to drive you home and stay with you the night of your procedure.    Please read over the following fact sheets that you were given:   [facility] Preparing for Surgery and or MRSA Information   ____ Take anti-hypertensive listed below, cardiac, seizure, asthma, anti-reflux and psychiatric medicines. These include:  1. NONE  2.  3.  4.  5.  6.  ____Fleets enema or Magnesium Citrate as directed.   ____ Use CHG Soap or sage wipes as directed on instruction sheet   ____ Use inhalers on the day of surgery and bring to hospital day of surgery  ____ Stop Metformin and Janumet 2 days prior to surgery.    ____ Take 1/2 of usual insulin dose the night before surgery and none on the morning surgery.   ____ Follow recommendations from Cardiologist, Pulmonologist or PCP regarding stopping Aspirin, Coumadin, Plavix ,Eliquis, Effient, or Pradaxa, and Pletal.  X____Stop Anti-inflammatories such as Advil, Aleve, Ibuprofen, Motrin, Naproxen, Naprosyn, Goodies powders or aspirin products NOW-OK to take Tylenol    ____ Stop supplements until after surgery.   ____ Bring C-Pap to the hospital.

## 2017-08-05 ENCOUNTER — Other Ambulatory Visit: Payer: Self-pay | Admitting: *Deleted

## 2017-08-05 ENCOUNTER — Encounter
Admission: RE | Admit: 2017-08-05 | Discharge: 2017-08-05 | Disposition: A | Discharge status: Home / Self Care | Payer: BLUE CROSS/BLUE SHIELD | Source: Ambulatory Visit | Attending: Obstetrics and Gynecology | Admitting: Obstetrics and Gynecology

## 2017-08-05 ENCOUNTER — Telehealth: Payer: Self-pay | Admitting: Obstetrics and Gynecology

## 2017-08-05 DIAGNOSIS — N393 Stress incontinence (female) (male): Secondary | ICD-10-CM | POA: Diagnosis not present

## 2017-08-05 DIAGNOSIS — Z01818 Encounter for other preprocedural examination: Secondary | ICD-10-CM | POA: Insufficient documentation

## 2017-08-05 LAB — LIPID PANEL
CHOLESTEROL: 184 mg/dL (ref 0–200)
HDL: 72 mg/dL (ref >40)
LDL Cholesterol: 102 mg/dL — ABNORMAL HIGH (ref 0–99)
Total CHOL/HDL Ratio: 2.6 RATIO
Triglycerides: 50 mg/dL (ref <150)
VLDL: 10 mg/dL (ref 0–40)

## 2017-08-05 LAB — BASIC METABOLIC PANEL
Anion gap: 9 (ref 5–15)
BUN: 13 mg/dL (ref 6–20)
CALCIUM: 9.3 mg/dL (ref 8.9–10.3)
CO2: 27 mmol/L (ref 22–32)
CREATININE: 0.67 mg/dL (ref 0.44–1.00)
Chloride: 102 mmol/L (ref 101–111)
GFR calc Af Amer: >60 mL/min (ref >60)
GLUCOSE: 77 mg/dL (ref 65–99)
Potassium: 4 mmol/L (ref 3.5–5.1)
SODIUM: 138 mmol/L (ref 135–145)

## 2017-08-05 LAB — TYPE AND SCREEN
ABO/RH(D): B POS
ANTIBODY SCREEN: NEGATIVE

## 2017-08-05 LAB — CBC
HEMATOCRIT: 39.2 % (ref 35.0–47.0)
Hemoglobin: 13.2 g/dL (ref 12.0–16.0)
MCH: 29.1 pg (ref 26.0–34.0)
MCHC: 33.6 g/dL (ref 32.0–36.0)
MCV: 86.8 fL (ref 80.0–100.0)
Platelets: 207 10*3/uL (ref 150–440)
RBC: 4.52 MIL/uL (ref 3.80–5.20)
RDW: 13 % (ref 11.5–14.5)
WBC: 5.4 10*3/uL (ref 3.6–11.0)

## 2017-08-05 LAB — TSH: TSH: 2.178 u[IU]/mL (ref 0.350–4.500)

## 2017-08-05 MED ORDER — ZOLPIDEM TARTRATE 10 MG PO TABS: 10.0000 mg | ORAL_TABLET | Freq: Every evening | ORAL | 2 refills | Status: DC | PRN

## 2017-08-05 NOTE — Telephone Encounter (Signed)
Done-ac 

## 2017-08-05 NOTE — Telephone Encounter (Signed)
Patient called requesting a refill on ambien. She had her physical done with Dr Logan BoresEvans during her pre op visit. Will she still need to come in to see Melody or can it be refilled. Thanks

## 2017-08-05 NOTE — Telephone Encounter (Signed)
Patient notified

## 2017-08-09 ENCOUNTER — Ambulatory Visit
Admission: RE | Admit: 2017-08-09 | Discharge: 2017-08-09 | Disposition: A | Discharge status: Home / Self Care | Payer: BLUE CROSS/BLUE SHIELD | Source: Ambulatory Visit | Attending: Obstetrics and Gynecology | Admitting: Obstetrics and Gynecology

## 2017-08-09 ENCOUNTER — Encounter
Admission: RE | Disposition: A | Discharge status: Home / Self Care | Payer: Self-pay | Source: Ambulatory Visit | Attending: Obstetrics and Gynecology

## 2017-08-09 ENCOUNTER — Ambulatory Visit: Payer: BLUE CROSS/BLUE SHIELD | Admitting: Anesthesiology

## 2017-08-09 DIAGNOSIS — N393 Stress incontinence (female) (male): Secondary | ICD-10-CM | POA: Diagnosis not present

## 2017-08-09 DIAGNOSIS — Z90710 Acquired absence of both cervix and uterus: Secondary | ICD-10-CM | POA: Diagnosis not present

## 2017-08-09 DIAGNOSIS — G4700 Insomnia, unspecified: Secondary | ICD-10-CM | POA: Diagnosis not present

## 2017-08-09 DIAGNOSIS — R32 Unspecified urinary incontinence: Secondary | ICD-10-CM | POA: Diagnosis present

## 2017-08-09 HISTORY — PX: PUBOVAGINAL SLING: SHX1035

## 2017-08-09 LAB — GLUCOSE, RANDOM: GLUCOSE: 113 mg/dL — AB (ref 65–99)

## 2017-08-09 LAB — ABO/RH: ABO/RH(D): B POS

## 2017-08-09 SURGERY — CREATION, PUBOVAGINAL SLING
Anesthesia: General

## 2017-08-09 MED ORDER — SUGAMMADEX SODIUM 200 MG/2ML IV SOLN
INTRAVENOUS | Status: DC | PRN
  Administered 2017-08-09: 150 mg via INTRAVENOUS

## 2017-08-09 MED ORDER — FAMOTIDINE 20 MG PO TABS
20.0000 mg | ORAL_TABLET | Freq: Once | ORAL | Status: AC
  Administered 2017-08-09: 20 mg via ORAL

## 2017-08-09 MED ORDER — ONDANSETRON HCL 4 MG/2ML IJ SOLN
INTRAMUSCULAR | Status: AC
  Filled 2017-08-09: qty 2

## 2017-08-09 MED ORDER — GLYCOPYRROLATE 0.2 MG/ML IJ SOLN
INTRAMUSCULAR | Status: AC
  Filled 2017-08-09: qty 1

## 2017-08-09 MED ORDER — VASOPRESSIN 20 UNIT/ML IV SOLN
INTRAVENOUS | Status: AC
  Filled 2017-08-09: qty 1

## 2017-08-09 MED ORDER — LIDOCAINE HCL (CARDIAC) 20 MG/ML IV SOLN
INTRAVENOUS | Status: DC | PRN
Start: 2017-08-09 — End: 2017-08-09
  Administered 2017-08-09: 100 mg via INTRAVENOUS

## 2017-08-09 MED ORDER — FENTANYL CITRATE (PF) 100 MCG/2ML IJ SOLN
INTRAMUSCULAR | Status: AC
  Filled 2017-08-09: qty 2

## 2017-08-09 MED ORDER — LIDOCAINE HCL (PF) 2 % IJ SOLN
INTRAMUSCULAR | Status: AC
  Filled 2017-08-09: qty 10

## 2017-08-09 MED ORDER — DEXAMETHASONE SODIUM PHOSPHATE 10 MG/ML IJ SOLN
INTRAMUSCULAR | Status: AC
  Filled 2017-08-09: qty 1

## 2017-08-09 MED ORDER — MIDAZOLAM HCL 2 MG/2ML IJ SOLN
INTRAMUSCULAR | Status: DC | PRN
  Administered 2017-08-09: 2 mg via INTRAVENOUS

## 2017-08-09 MED ORDER — FENTANYL CITRATE (PF) 100 MCG/2ML IJ SOLN
INTRAMUSCULAR | Status: DC | PRN
  Administered 2017-08-09 (×2): 50 ug via INTRAVENOUS

## 2017-08-09 MED ORDER — VASOPRESSIN 20 UNIT/ML IJ SOLN
INTRAMUSCULAR | Status: DC | PRN
  Administered 2017-08-09: 6 [IU]

## 2017-08-09 MED ORDER — SODIUM CHLORIDE FLUSH 0.9 % IV SOLN
INTRAVENOUS | Status: AC
  Filled 2017-08-09: qty 30

## 2017-08-09 MED ORDER — LACTATED RINGERS IV SOLN: INTRAVENOUS | Status: DC

## 2017-08-09 MED ORDER — LIDOCAINE-EPINEPHRINE 1 %-1:100000 IJ SOLN
INTRAMUSCULAR | Status: AC
  Filled 2017-08-09: qty 1

## 2017-08-09 MED ORDER — NITROFURANTOIN MONOHYD MACRO 100 MG PO CAPS: 100.0000 mg | ORAL_CAPSULE | ORAL | 0 refills | Status: DC

## 2017-08-09 MED ORDER — FAMOTIDINE 20 MG PO TABS
ORAL_TABLET | ORAL | Status: AC
  Administered 2017-08-09: 20 mg
  Filled 2017-08-09: qty 1

## 2017-08-09 MED ORDER — DEXAMETHASONE SODIUM PHOSPHATE 10 MG/ML IJ SOLN
INTRAMUSCULAR | Status: DC | PRN
  Administered 2017-08-09: 10 mg via INTRAVENOUS

## 2017-08-09 MED ORDER — KETOROLAC TROMETHAMINE 30 MG/ML IJ SOLN
INTRAMUSCULAR | Status: AC
  Filled 2017-08-09: qty 1

## 2017-08-09 MED ORDER — ACETAMINOPHEN NICU IV SYRINGE 10 MG/ML
INTRAVENOUS | Status: AC
  Filled 2017-08-09: qty 1

## 2017-08-09 MED ORDER — MIDAZOLAM HCL 2 MG/2ML IJ SOLN
INTRAMUSCULAR | Status: AC
  Filled 2017-08-09: qty 2

## 2017-08-09 MED ORDER — KETOROLAC TROMETHAMINE 30 MG/ML IJ SOLN
INTRAMUSCULAR | Status: DC | PRN
  Administered 2017-08-09: 30 mg via INTRAVENOUS

## 2017-08-09 MED ORDER — OXYCODONE HCL 5 MG/5ML PO SOLN: 5.0000 mg | Freq: Once | ORAL | Status: DC | PRN

## 2017-08-09 MED ORDER — ACETAMINOPHEN 10 MG/ML IV SOLN
INTRAVENOUS | Status: DC | PRN
  Administered 2017-08-09: 1000 mg via INTRAVENOUS

## 2017-08-09 MED ORDER — ROCURONIUM BROMIDE 100 MG/10ML IV SOLN
INTRAVENOUS | Status: DC | PRN
  Administered 2017-08-09: 30 mg via INTRAVENOUS
  Administered 2017-08-09: 10 mg via INTRAVENOUS

## 2017-08-09 MED ORDER — OXYCODONE HCL 5 MG PO TABS: 5.0000 mg | ORAL_TABLET | Freq: Once | ORAL | Status: DC | PRN

## 2017-08-09 MED ORDER — SUGAMMADEX SODIUM 200 MG/2ML IV SOLN
INTRAVENOUS | Status: AC
  Filled 2017-08-09: qty 2

## 2017-08-09 MED ORDER — PROPOFOL 10 MG/ML IV BOLUS
INTRAVENOUS | Status: AC
  Filled 2017-08-09: qty 20

## 2017-08-09 MED ORDER — HYDROCODONE-ACETAMINOPHEN 5-325 MG PO TABS: 1.0000 | ORAL_TABLET | Freq: Four times a day (QID) | ORAL | 0 refills | Status: DC | PRN

## 2017-08-09 MED ORDER — LACTATED RINGERS IV SOLN
INTRAVENOUS | Status: DC
  Administered 2017-08-09: 13:00:00 via INTRAVENOUS

## 2017-08-09 MED ORDER — ONDANSETRON HCL 4 MG/2ML IJ SOLN
INTRAMUSCULAR | Status: DC | PRN
  Administered 2017-08-09: 4 mg via INTRAVENOUS

## 2017-08-09 MED ORDER — FENTANYL CITRATE (PF) 100 MCG/2ML IJ SOLN: 25.0000 ug | INTRAMUSCULAR | Status: DC | PRN

## 2017-08-09 MED ORDER — PROPOFOL 10 MG/ML IV BOLUS
INTRAVENOUS | Status: DC | PRN
  Administered 2017-08-09: 150 mg via INTRAVENOUS

## 2017-08-09 SURGICAL SUPPLY — 23 items
BAG URINE DRAINAGE (UROLOGICAL SUPPLIES) ×1 IMPLANT
CATH FOLEY 2WAY  5CC 16FR (CATHETERS) ×1
CATH FOLEY 2WAY 5CC 16FR (CATHETERS) ×1
CATH URTH 16FR FL 2W BLN LF (CATHETERS) IMPLANT
DRAPE LAPAROTOMY 100X77 ABD (DRAPES) IMPLANT
DRAPE PERI LITHO V/GYN (MISCELLANEOUS) ×1 IMPLANT
DRAPE UNDER BUTTOCK W/FLU (DRAPES) ×1 IMPLANT
ELECT CAUTERY BLADE 6.4 (BLADE) ×1 IMPLANT
GLOVE BIO SURGEON STRL SZ8 (GLOVE) ×6 IMPLANT
GOWN STRL REUS W/ TWL LRG LVL3 (GOWN DISPOSABLE) IMPLANT
GOWN STRL REUS W/ TWL XL LVL3 (GOWN DISPOSABLE) IMPLANT
GOWN STRL REUS W/TWL LRG LVL3 (GOWN DISPOSABLE) ×4
GOWN STRL REUS W/TWL XL LVL3 (GOWN DISPOSABLE) ×2
PACK BASIN MINOR ARMC (MISCELLANEOUS) ×1 IMPLANT
PAD OB MATERNITY 4.3X12.25 (PERSONAL CARE ITEMS) ×1 IMPLANT
SLING MESH NAB TRANSOBTURATOR (Mesh General) ×1 IMPLANT
STRAP SAFETY BODY (MISCELLANEOUS) ×1 IMPLANT
SURGILUBE 2OZ TUBE FLIPTOP (MISCELLANEOUS) ×1 IMPLANT
SUT VIC AB 0 CT1 27 (SUTURE) ×2
SUT VIC AB 0 CT1 27XCR 8 STRN (SUTURE) IMPLANT
SUT VIC AB 2-0 CT1 (SUTURE) ×1 IMPLANT
SUT VICRYL+ 3-0 36IN CT-1 (SUTURE) ×1 IMPLANT
SYR CONTROL 10ML (SYRINGE) ×1 IMPLANT

## 2017-08-09 NOTE — Discharge Instructions (Signed)
AMBULATORY SURGERY  DISCHARGE INSTRUCTIONS   1) The drugs that you were given will stay in your system until tomorrow so for the next 24 hours you should not:  A) Drive an automobile B) Make any legal decisions C) Drink any alcoholic beverage   2) You may resume regular meals tomorrow.  Today it is better to start with liquids and gradually work up to solid foods.  You may eat anything you prefer, but it is better to start with liquids, then soup and crackers, and gradually work up to solid foods.   3) Please notify your doctor immediately if you have any unusual bleeding, trouble breathing, redness and pain at the surgery site, drainage, fever, or pain not relieved by medication.   4) Additional Instructions:Call Dr. Michel HarrowEvan's office tomorrow and make follow up appointment for next Monday or Tuesday.

## 2017-08-09 NOTE — Anesthesia Post-op Follow-up Note (Signed)
Anesthesia QCDR form completed.        

## 2017-08-09 NOTE — Anesthesia Preprocedure Evaluation (Signed)
Anesthesia Evaluation  Patient identified by MRN, date of birth, ID band Patient awake    Reviewed: Allergy & Precautions, H&P , NPO status , Patient's Chart, lab work & pertinent test results  History of Anesthesia Complications Negative for: history of anesthetic complications  Airway Mallampati: III  TM Distance: >3 FB Neck ROM: full    Dental  (+) Chipped   Pulmonary neg pulmonary ROS, neg shortness of breath,           Cardiovascular Exercise Tolerance: Good negative cardio ROS       Neuro/Psych negative neurological ROS  negative psych ROS   GI/Hepatic negative GI ROS, Neg liver ROS,   Endo/Other  negative endocrine ROS  Renal/GU      Musculoskeletal   Abdominal   Peds  Hematology negative hematology ROS (+)   Anesthesia Other Findings Past Medical History: No date: Insomnia  Past Surgical History: No date: ABDOMINAL HYSTERECTOMY 2010: BREAST BIOPSY; Right     Comment:  neg No date: LEG SURGERY; Right     Comment:  COMPARTMENT SYNDROME  BMI    Body Mass Index:  24.80 kg/m      Reproductive/Obstetrics negative OB ROS                             Anesthesia Physical Anesthesia Plan  ASA: II  Anesthesia Plan: General ETT   Post-op Pain Management:    Induction: Intravenous  PONV Risk Score and Plan: Propofol infusion  Airway Management Planned: Oral ETT  Additional Equipment:   Intra-op Plan:   Post-operative Plan: Extubation in OR  Informed Consent: I have reviewed the patients History and Physical, chart, labs and discussed the procedure including the risks, benefits and alternatives for the proposed anesthesia with the patient or authorized representative who has indicated his/her understanding and acceptance.   Dental Advisory Given  Plan Discussed with: Anesthesiologist, CRNA and Surgeon  Anesthesia Plan Comments: (Patient consented for risks of  anesthesia including but not limited to:  - adverse reactions to medications - damage to teeth, lips or other oral mucosa - sore throat or hoarseness - Damage to heart, brain, lungs or loss of life  Patient voiced understanding.)        Anesthesia Quick Evaluation

## 2017-08-09 NOTE — Anesthesia Procedure Notes (Signed)
Procedure Name: Intubation Date/Time: 08/09/2017 1:09 PM Performed by: Ginger CarneMichelet, Sylvania Moss, CRNA Pre-anesthesia Checklist: Patient identified, Emergency Drugs available, Suction available, Patient being monitored and Timeout performed Patient Re-evaluated:Patient Re-evaluated prior to induction Oxygen Delivery Method: Circle system utilized Preoxygenation: Pre-oxygenation with 100% oxygen Induction Type: IV induction Ventilation: Mask ventilation without difficulty Laryngoscope Size: Miller and 2 Grade View: Grade I Tube type: Oral Tube size: 7.0 mm Number of attempts: 1 Airway Equipment and Method: Stylet Placement Confirmation: ETT inserted through vocal cords under direct vision,  positive ETCO2 and breath sounds checked- equal and bilateral Secured at: 20 cm Tube secured with: Tape Dental Injury: Teeth and Oropharynx as per pre-operative assessment

## 2017-08-09 NOTE — OR Nursing (Signed)
Danielle SackKendra from mother [facility] here to instruct pt in I+O cath after voiding.

## 2017-08-09 NOTE — Op Note (Signed)
       OPERATIVE NOTE 08/09/2017 2:13 PM  PRE-OPERATIVE DIAGNOSIS:  1) STRESS URINARY INCONTINENCE  POST-OPERATIVE DIAGNOSIS:  1) Same  OPERATION: Procedure(s) (LRB): URETHRAL SLING-TOT (N/A)  SURGEON(S): Surgeon(s) and Role:    Linzie Collin* Evans, David James, MD - Primary   ANESTHESIA: General  ESTIMATED BLOOD LOSS: 10 mL  OPERATIVE FINDINGS:   SPECIMEN: * No specimens in log *  COMPLICATIONS: None  DRAINS: Foley to gravity  DISPOSITION: Stable to recovery room  PROCEDURE: The vaginal mucosa beginning midway between the vaginal cuff and urethral opening, was grasped with Allis clamps. A midline incision was made only through the vaginal mucosa with some fascia. The vaginal mucosa was dissected laterally from the underlying fascia. A Foley catheter was placed within the bladder and the bladder was emptied. Clear urine was noted. The obturator foramina were identified in the usual manner bilaterally and marked with a marking pen the skin and subcutaneous tissues were injected with a dilute Pitressin solution. Stab incisions were made and the TOT trochars were placed through these incisions onto the operator's finger in the vagina which was also retracting the bladder medially. The vaginal tape was then placed on the trochars and reversed through these incisions. A Kelly clamp was placed under the tape.  The tape was noted to be correctly positioned underneath the urethra near the urethral vesicular junction without twists. The excess tape was removed at the level of the skin. Dermabond was applied over these small skin incisions. The fascia was then plicated carefully covering the tension-free vaginal tape with thickened fascia.  Vaginal mucosa was then closed in the midline with interrupted subcuticular sutures, again creating a thickened area over the bladder sling.. The vaginal cuff was closed with Vicryl suture. Hemostasis was noted.  Elonda Huskyavid J. Evans, M.D. 08/09/2017 2:13 PM    .Nyoka Lintdje

## 2017-08-09 NOTE — Anesthesia Postprocedure Evaluation (Signed)
Anesthesia Post Note  Patient: Danielle Greene  Procedure(s) Performed: URETHRAL SLING-TOT (N/A )  Patient location during evaluation: Endoscopy Anesthesia Type: General Level of consciousness: awake and alert Pain management: pain level controlled Vital Signs Assessment: post-procedure vital signs reviewed and stable Respiratory status: spontaneous breathing, nonlabored ventilation, respiratory function stable and patient connected to nasal cannula oxygen Cardiovascular status: blood pressure returned to baseline and stable Postop Assessment: no apparent nausea or vomiting Anesthetic complications: no     Last Vitals:  Vitals:   08/09/17 1513 08/09/17 1518  BP: 112/61 128/64  Pulse: (!) 55 61  Resp: 15 16  Temp: 36.8 C (!) 36.3 C  SpO2: 97% 99%    Last Pain:  Vitals:   08/09/17 1518  TempSrc:   PainSc: 0-No pain                 Cleda MccreedyJoseph K Aleksia Freiman

## 2017-08-09 NOTE — Interval H&P Note (Signed)
History and Physical Interval Note:  08/09/2017 12:56 PM  Danielle Greene  has presented today for surgery, with the diagnosis of STRESS URINARY INCONTINENCE  The various methods of treatment have been discussed with the patient and family. After consideration of risks, benefits and other options for treatment, the patient has consented to  Procedure(s): URETHRAL SLING-TOT (N/A) as a surgical intervention .  The patient's history has been reviewed, patient examined, no change in status, stable for surgery.  I have reviewed the patient's chart and labs.  Questions were answered to the patient's satisfaction.     Brennan Baileyavid Shakeda Pearse

## 2017-08-09 NOTE — Transfer of Care (Signed)
Immediate Anesthesia Transfer of Care Note  Patient: Evangelene L Wanzer  Procedure(s) Performed: URETHRAL SLING-TOT (N/A )  Patient Location: PACU  Anesthesia Type:General  Level of Consciousness: sedated  Airway & Oxygen Therapy: Patient Spontanous Breathing and Patient connected to face mask oxygen  Post-op Assessment: Report given to RN and Post -op Vital signs reviewed and stable  Post vital signs: Reviewed and stable  Last Vitals:  Vitals:   08/09/17 1213 08/09/17 1428  BP: 120/61 (!) 120/57  Pulse: 62 84  Resp: 18 (!) 30  Temp: 36.8 C (!) 36.2 C  SpO2: 100% 100%    Last Pain:  Vitals:   08/09/17 1428  TempSrc: Tympanic         Complications: No apparent anesthesia complications

## 2017-08-17 ENCOUNTER — Encounter: Payer: Self-pay | Admitting: Obstetrics and Gynecology

## 2017-08-17 ENCOUNTER — Ambulatory Visit (INDEPENDENT_AMBULATORY_CARE_PROVIDER_SITE_OTHER): Payer: BLUE CROSS/BLUE SHIELD | Admitting: Obstetrics and Gynecology

## 2017-08-17 ENCOUNTER — Encounter: Payer: BLUE CROSS/BLUE SHIELD | Admitting: Obstetrics and Gynecology

## 2017-08-17 VITALS — BP 120/71 | HR 62 | Ht 63.0 in | Wt 142.2 lb

## 2017-08-17 DIAGNOSIS — Z9889 Other specified postprocedural states: Secondary | ICD-10-CM

## 2017-08-17 DIAGNOSIS — N393 Stress incontinence (female) (male): Secondary | ICD-10-CM

## 2017-08-17 NOTE — Progress Notes (Signed)
HPI:      Danielle Greene is a 46 y.o. [name] who LMP was Patient's last menstrual period was 10/24/2013.  Subjective:   She presents today 4 weeks postop from TOT.  She reports that she has resumed normal activities with the exception of intercourse and heavy lifting.  She feels well.  She has no pain.  She reports that she is not experiencing urine leakage. She is voiding without difficulty.  She used the self catheter less than 1 day.    Hx: The following portions of the patient's history were reviewed and updated as appropriate:             She  has a past medical history of Insomnia. She does not have any pertinent problems on file. She  has a past surgical history that includes Breast biopsy (Right, 2010); Abdominal hysterectomy; Leg Surgery (Right); and Pubovaginal sling (N/A, 08/09/2017). Her family history includes Dementia in her mother. She  reports that  has never smoked. she has never used smokeless tobacco. She reports that she drinks alcohol. She reports that she does not use drugs. She is allergic to adhesive [tape].       Review of Systems:  Review of Systems  Constitutional: Denied constitutional symptoms, night sweats, recent illness, fatigue, fever, insomnia and weight loss.  Eyes: Denied eye symptoms, eye pain, photophobia, vision change and visual disturbance.  Ears/Nose/Throat/Neck: Denied ear, nose, throat or neck symptoms, hearing loss, nasal discharge, sinus congestion and sore throat.  Cardiovascular: Denied cardiovascular symptoms, arrhythmia, chest pain/pressure, edema, exercise intolerance, orthopnea and palpitations.  Respiratory: Denied pulmonary symptoms, asthma, pleuritic pain, productive sputum, cough, dyspnea and wheezing.  Gastrointestinal: Denied, gastro-esophageal reflux, melena, nausea and vomiting.  Genitourinary: Denied genitourinary symptoms including symptomatic vaginal discharge, pelvic relaxation issues, and urinary complaints.   Musculoskeletal: Denied musculoskeletal symptoms, stiffness, swelling, muscle weakness and myalgia.  Dermatologic: Denied dermatology symptoms, rash and scar.  Neurologic: Denied neurology symptoms, dizziness, headache, neck pain and syncope.  Psychiatric: Denied psychiatric symptoms, anxiety and depression.  Endocrine: Denied endocrine symptoms including hot flashes and night sweats.   Meds:   Current Outpatient Medications on File Prior to Visit  Medication Sig Dispense Refill  . Dermatological Products, Misc. (HYLATOPIC PLUS) CREA Apply 1 Dose topically as needed.    . desoximetasone (TOPICORT) 0.25 % cream Apply 1 application topically as needed.    . Desoximetasone (TOPICORT) 0.25 % LIQD Apply 1 spray topically as needed (MOSTLY DURING WINTER MONTHS).    Marland Kitchen. zolpidem (AMBIEN) 10 MG tablet Take 1 tablet (10 mg total) by mouth at bedtime as needed for sleep. 30 tablet 2   No current facility-administered medications on file prior to visit.     Objective:     Vitals:   08/17/17 1153  BP: 120/71  Pulse: 62                Assessment:    [name] Patient Active Problem List   Diagnosis Date Noted  . SUI (stress urinary incontinence, female) 07/14/2017     1. Post-operative state   2. SUI (stress urinary incontinence, female)     Excellent recovery and result so far.   Plan:            1.  No intercourse, no heavy lifting otherwise no restrictions. Orders No orders of the defined types were placed in this encounter.   No orders of the defined types were placed in this encounter.     F/U  Return in about 4 weeks (around 09/14/2017). I spent 15 minutes with this patient of which greater than 50% was spent discussing her surgery, her postop course, her current condition, her preop lab work and annual exam lab work, current restrictions.  Danielle Greene, M.D. 08/17/2017 12:23 PM

## 2017-08-19 ENCOUNTER — Encounter: Payer: BLUE CROSS/BLUE SHIELD | Admitting: Obstetrics and Gynecology

## 2017-09-14 ENCOUNTER — Ambulatory Visit (INDEPENDENT_AMBULATORY_CARE_PROVIDER_SITE_OTHER): Payer: BLUE CROSS/BLUE SHIELD | Admitting: Obstetrics and Gynecology

## 2017-09-14 ENCOUNTER — Encounter: Payer: Self-pay | Admitting: Obstetrics and Gynecology

## 2017-09-14 VITALS — BP 129/65 | HR 56 | Ht 63.0 in | Wt 146.2 lb

## 2017-09-14 DIAGNOSIS — Z9889 Other specified postprocedural states: Secondary | ICD-10-CM

## 2017-09-14 NOTE — Progress Notes (Signed)
HPI:      [name] is a 47 y.o. [name] who LMP was Patient's last menstrual period was 10/24/2013.  Subjective:   She presents today partially 4 weeks from TOT.  Patient has not been leaking but yesterday began exercising and suddenly had much greater amount of leakage than expected.  She is understandably disappointed. She reports no signs or symptoms of bladder infection. Has not resumed intercourse.    Hx: The following portions of the patient's history were reviewed and updated as appropriate:             She  has a past medical history of Insomnia. She does not have any pertinent problems on file. She  has a past surgical history that includes Breast biopsy (Right, 2010); Abdominal hysterectomy; Leg Surgery (Right); and Pubovaginal sling (N/A, 08/09/2017). Her family history includes Dementia in her mother. She  reports that  has never smoked. she has never used smokeless tobacco. She reports that she drinks alcohol. She reports that she does not use drugs. She is allergic to adhesive [tape].       Review of Systems:  Review of Systems  Constitutional: Denied constitutional symptoms, night sweats, recent illness, fatigue, fever, insomnia and weight loss.  Eyes: Denied eye symptoms, eye pain, photophobia, vision change and visual disturbance.  Ears/Nose/Throat/Neck: Denied ear, nose, throat or neck symptoms, hearing loss, nasal discharge, sinus congestion and sore throat.  Cardiovascular: Denied cardiovascular symptoms, arrhythmia, chest pain/pressure, edema, exercise intolerance, orthopnea and palpitations.  Respiratory: Denied pulmonary symptoms, asthma, pleuritic pain, productive sputum, cough, dyspnea and wheezing.  Gastrointestinal: Denied, gastro-esophageal reflux, melena, nausea and vomiting.  Genitourinary: See HPI for additional information.  Musculoskeletal: Denied musculoskeletal symptoms, stiffness, swelling, muscle weakness and myalgia.  Dermatologic:  Denied dermatology symptoms, rash and scar.  Neurologic: Denied neurology symptoms, dizziness, headache, neck pain and syncope.  Psychiatric: Denied psychiatric symptoms, anxiety and depression.  Endocrine: Denied endocrine symptoms including hot flashes and night sweats.   Meds:   Current Outpatient Medications on File Prior to Visit  Medication Sig Dispense Refill  . Dermatological Products, Misc. (HYLATOPIC PLUS) CREA Apply 1 Dose topically as needed.    . desoximetasone (TOPICORT) 0.25 % cream Apply 1 application topically as needed.    . Desoximetasone (TOPICORT) 0.25 % LIQD Apply 1 spray topically as needed (MOSTLY DURING WINTER MONTHS).    Marland Kitchen zolpidem (AMBIEN) 10 MG tablet Take 1 tablet (10 mg total) by mouth at bedtime as needed for sleep. 30 tablet 2   No current facility-administered medications on file prior to visit.     Objective:     Vitals:   09/14/17 1546  BP: 129/65  Pulse: (!) 56              Physical examination   Pelvic:   Vulva: Normal appearance.  No lesions.  Vagina: No lesions or abnormalities noted.  Mesh well covered.  Anterior vaginal mucosa healed.  Support: Normal pelvic support.  Urethra No masses tenderness or scarring.  Meatus Normal size without lesions or prolapse.  Cervix: Normal appearance.  No lesions.  Anus: Normal exam.  No lesions.  Perineum: Normal exam.  No lesions.     Assessment:    [name] Patient Active Problem List   Diagnosis Date Noted  . SUI (stress urinary incontinence, female) 07/14/2017     1. Post-operative state     Patient having some sudden urine leakage yesterday.   Plan:  1.  Urine for C&S.  2.  Discussed modifying fluid intake as this used to be part of the patient's daily practice.  3.  Follow-up 3 months. Orders No orders of the defined types were placed in this encounter.   No orders of the defined types were placed in this encounter.     F/U  Return in about 3 months (around  12/13/2017).  Elonda Huskyavid J. Evans, M.D. 09/14/2017 4:09 PM

## 2017-09-16 LAB — URINE CULTURE

## 2017-09-30 DIAGNOSIS — L2089 Other atopic dermatitis: Secondary | ICD-10-CM | POA: Diagnosis not present

## 2017-09-30 DIAGNOSIS — L738 Other specified follicular disorders: Secondary | ICD-10-CM | POA: Diagnosis not present

## 2017-10-14 DIAGNOSIS — L2089 Other atopic dermatitis: Secondary | ICD-10-CM | POA: Diagnosis not present

## 2017-10-14 DIAGNOSIS — L738 Other specified follicular disorders: Secondary | ICD-10-CM | POA: Diagnosis not present

## 2017-11-11 DIAGNOSIS — D239 Other benign neoplasm of skin, unspecified: Secondary | ICD-10-CM | POA: Diagnosis not present

## 2017-11-11 DIAGNOSIS — L738 Other specified follicular disorders: Secondary | ICD-10-CM | POA: Diagnosis not present

## 2017-11-11 DIAGNOSIS — L2089 Other atopic dermatitis: Secondary | ICD-10-CM | POA: Diagnosis not present

## 2017-11-18 DIAGNOSIS — I8312 Varicose veins of left lower extremity with inflammation: Secondary | ICD-10-CM | POA: Diagnosis not present

## 2017-11-18 DIAGNOSIS — L400 Psoriasis vulgaris: Secondary | ICD-10-CM | POA: Diagnosis not present

## 2017-11-18 DIAGNOSIS — I8311 Varicose veins of right lower extremity with inflammation: Secondary | ICD-10-CM | POA: Diagnosis not present

## 2017-11-19 DIAGNOSIS — S86819A Strain of other muscle(s) and tendon(s) at lower leg level, unspecified leg, initial encounter: Secondary | ICD-10-CM | POA: Insufficient documentation

## 2017-11-19 DIAGNOSIS — S86112A Strain of other muscle(s) and tendon(s) of posterior muscle group at lower leg level, left leg, initial encounter: Secondary | ICD-10-CM | POA: Diagnosis not present

## 2017-11-24 DIAGNOSIS — L738 Other specified follicular disorders: Secondary | ICD-10-CM | POA: Diagnosis not present

## 2017-11-24 DIAGNOSIS — R21 Rash and other nonspecific skin eruption: Secondary | ICD-10-CM | POA: Diagnosis not present

## 2017-11-24 DIAGNOSIS — I872 Venous insufficiency (chronic) (peripheral): Secondary | ICD-10-CM | POA: Diagnosis not present

## 2017-11-24 DIAGNOSIS — L986 Other infiltrative disorders of the skin and subcutaneous tissue: Secondary | ICD-10-CM | POA: Diagnosis not present

## 2017-12-08 DIAGNOSIS — L905 Scar conditions and fibrosis of skin: Secondary | ICD-10-CM | POA: Diagnosis not present

## 2017-12-08 DIAGNOSIS — L280 Lichen simplex chronicus: Secondary | ICD-10-CM | POA: Diagnosis not present

## 2017-12-14 ENCOUNTER — Ambulatory Visit (INDEPENDENT_AMBULATORY_CARE_PROVIDER_SITE_OTHER): Payer: BLUE CROSS/BLUE SHIELD | Admitting: Obstetrics and Gynecology

## 2017-12-14 ENCOUNTER — Encounter: Payer: Self-pay | Admitting: Obstetrics and Gynecology

## 2017-12-14 VITALS — BP 127/70 | HR 60 | Ht 63.0 in | Wt 142.2 lb

## 2017-12-14 DIAGNOSIS — N393 Stress incontinence (female) (male): Secondary | ICD-10-CM | POA: Diagnosis not present

## 2017-12-14 NOTE — Progress Notes (Signed)
HPI:      [name] is a 47 y.o. [name] who LMP was Patient's last menstrual period was 10/24/2013.  Subjective:   She presents today as a follow-up to her previous TOT surgery for stress incontinence.  She states she is having no incontinence doing her normal activities however when she exercises with jumping and lifting she leaks urine.  Because she is very involved with exercise of approximately 4 times a week this is upsetting to her.  If there is any possibility of stopping this particular leakage she would like to know how to do it.    Hx: The following portions of the patient's history were reviewed and updated as appropriate:             She  has a past medical history of Insomnia. She does not have any pertinent problems on file. She  has a past surgical history that includes Breast biopsy (Right, 2010); Abdominal hysterectomy; Leg Surgery (Right); and Pubovaginal sling (N/A, 08/09/2017). Her family history includes Dementia in her mother. She  reports that she has never smoked. She has never used smokeless tobacco. She reports that she drinks alcohol. She reports that she does not use drugs. She has a current medication list which includes the following prescription(s): cetirizine, clindamycin, hylatopic plus, desoximetasone, desoximetasone, hydroxyzine, meloxicam, and zolpidem. She is allergic to adhesive [tape].       Review of Systems:  Review of Systems  Constitutional: Denied constitutional symptoms, night sweats, recent illness, fatigue, fever, insomnia and weight loss.  Eyes: Denied eye symptoms, eye pain, photophobia, vision change and visual disturbance.  Ears/Nose/Throat/Neck: Denied ear, nose, throat or neck symptoms, hearing loss, nasal discharge, sinus congestion and sore throat.  Cardiovascular: Denied cardiovascular symptoms, arrhythmia, chest pain/pressure, edema, exercise intolerance, orthopnea and palpitations.  Respiratory: Denied pulmonary  symptoms, asthma, pleuritic pain, productive sputum, cough, dyspnea and wheezing.  Gastrointestinal: Denied, gastro-esophageal reflux, melena, nausea and vomiting.  Genitourinary:  Urine leakage with strenuous exercise  Musculoskeletal: Denied musculoskeletal symptoms, stiffness, swelling, muscle weakness and myalgia.  Dermatologic: Denied dermatology symptoms, rash and scar.  Neurologic: Denied neurology symptoms, dizziness, headache, neck pain and syncope.  Psychiatric: Denied psychiatric symptoms, anxiety and depression.  Endocrine: Denied endocrine symptoms including hot flashes and night sweats.   Meds:   Current Outpatient Medications on File Prior to Visit  Medication Sig Dispense Refill  . cetirizine (ZYRTEC) 10 MG tablet Take 10 mg by mouth daily.    . clindamycin (CLEOCIN T) 1 % external solution   2  . Dermatological Products, Misc. (HYLATOPIC PLUS) CREA Apply 1 Dose topically as needed.    . desoximetasone (TOPICORT) 0.25 % cream Apply 1 application topically as needed.    . Desoximetasone (TOPICORT) 0.25 % LIQD Apply 1 spray topically as needed (MOSTLY DURING WINTER MONTHS).    . hydrOXYzine (ATARAX/VISTARIL) 25 MG tablet Take 25 mg by mouth 3 (three) times daily as needed.    . meloxicam (MOBIC) 15 MG tablet Take 15 mg by mouth daily.    Marland Kitchen. zolpidem (AMBIEN) 10 MG tablet Take 1 tablet (10 mg total) by mouth at bedtime as needed for sleep. 30 tablet 2   No current facility-administered medications on file prior to visit.     Objective:     Vitals:   12/14/17 0808  BP: 127/70  Pulse: 60              Vaginal examination reveals minimal cystocele no rectocele good support mesh well  covered.  Multiple pessaries inserted and tried Size 4 dish pessary with incontinence bulb probably the best tight fit.  Assessment:    [name] Patient Active Problem List   Diagnosis Date Noted  . SUI (stress urinary incontinence, female) 07/14/2017     1. SUI (stress urinary  incontinence, female)     Patient is interested in wearing a pessary for the hour to hour and a half of strenuous exercise in an attempt to limit urine leakage during this time.  She will inserted exercise immediately prior and remove it immediately after.  We have sized it so that it is quite tight in the vagina with pressure on the urethra and I have made it clear that this is not to be worn for several hours at a time. We have also discussed the possibility of using Detrol LA and the patient declined this.   Plan:            1.  See above-tight pessary ordered. Orders No orders of the defined types were placed in this encounter.   No orders of the defined types were placed in this encounter.     F/U  No follow-ups on file. I spent 31 minutes with this patient of which greater than 50% was spent discussing her urine loss and the possible solutions to this problem during strenuous exercise.  We also spent approximately 20 minutes trying multiple pessaries in different sizes and types.  Elonda Husky, M.D. 12/14/2017 10:43 AM

## 2017-12-16 DIAGNOSIS — S86112A Strain of other muscle(s) and tendon(s) of posterior muscle group at lower leg level, left leg, initial encounter: Secondary | ICD-10-CM | POA: Diagnosis not present

## 2017-12-29 DIAGNOSIS — M79604 Pain in right leg: Secondary | ICD-10-CM | POA: Diagnosis not present

## 2017-12-29 DIAGNOSIS — M79605 Pain in left leg: Secondary | ICD-10-CM | POA: Diagnosis not present

## 2018-01-04 DIAGNOSIS — M79604 Pain in right leg: Secondary | ICD-10-CM | POA: Diagnosis not present

## 2018-01-04 DIAGNOSIS — M79605 Pain in left leg: Secondary | ICD-10-CM | POA: Diagnosis not present

## 2018-01-06 DIAGNOSIS — L309 Dermatitis, unspecified: Secondary | ICD-10-CM | POA: Diagnosis not present

## 2018-01-06 DIAGNOSIS — L272 Dermatitis due to ingested food: Secondary | ICD-10-CM | POA: Diagnosis not present

## 2018-01-14 DIAGNOSIS — I8312 Varicose veins of left lower extremity with inflammation: Secondary | ICD-10-CM | POA: Diagnosis not present

## 2018-02-25 DIAGNOSIS — Z1283 Encounter for screening for malignant neoplasm of skin: Secondary | ICD-10-CM | POA: Diagnosis not present

## 2018-02-25 DIAGNOSIS — L578 Other skin changes due to chronic exposure to nonionizing radiation: Secondary | ICD-10-CM | POA: Diagnosis not present

## 2018-02-25 DIAGNOSIS — Z86018 Personal history of other benign neoplasm: Secondary | ICD-10-CM | POA: Diagnosis not present

## 2018-04-01 DIAGNOSIS — I8312 Varicose veins of left lower extremity with inflammation: Secondary | ICD-10-CM | POA: Diagnosis not present

## 2018-04-01 DIAGNOSIS — M79604 Pain in right leg: Secondary | ICD-10-CM | POA: Diagnosis not present

## 2018-04-01 DIAGNOSIS — M79605 Pain in left leg: Secondary | ICD-10-CM | POA: Diagnosis not present

## 2018-04-12 ENCOUNTER — Other Ambulatory Visit: Payer: Self-pay | Admitting: Obstetrics and Gynecology

## 2018-04-19 DIAGNOSIS — L302 Cutaneous autosensitization: Secondary | ICD-10-CM | POA: Diagnosis not present

## 2018-05-18 DIAGNOSIS — L2089 Other atopic dermatitis: Secondary | ICD-10-CM | POA: Diagnosis not present

## 2018-05-26 DIAGNOSIS — Z23 Encounter for immunization: Secondary | ICD-10-CM | POA: Diagnosis not present

## 2018-05-30 DIAGNOSIS — L03011 Cellulitis of right finger: Secondary | ICD-10-CM | POA: Diagnosis not present

## 2018-05-30 DIAGNOSIS — L2089 Other atopic dermatitis: Secondary | ICD-10-CM | POA: Diagnosis not present

## 2018-06-02 ENCOUNTER — Other Ambulatory Visit: Payer: Self-pay

## 2018-06-02 ENCOUNTER — Ambulatory Visit
Admission: EM | Admit: 2018-06-02 | Discharge: 2018-06-02 | Disposition: A | Discharge status: Home / Self Care | Payer: BLUE CROSS/BLUE SHIELD | Attending: Family Medicine | Admitting: Family Medicine

## 2018-06-02 ENCOUNTER — Encounter: Payer: Self-pay | Admitting: Emergency Medicine

## 2018-06-02 DIAGNOSIS — L03011 Cellulitis of right finger: Secondary | ICD-10-CM

## 2018-06-02 MED ORDER — DOXYCYCLINE HYCLATE 100 MG PO CAPS: 100.0000 mg | ORAL_CAPSULE | Freq: Two times a day (BID) | ORAL | 0 refills | Status: DC

## 2018-06-02 NOTE — ED Provider Notes (Signed)
[facility]    CSN: [id number] Arrival date & time: 06/02/18  1842  History   Chief Complaint Chief Complaint  Patient presents with  . Hand Pain    right thumb   HPI  47 year old female presents with the above complaint.  1 week history of pain around the nailbed of the right thumb.  [facility]  Diagnosed with paronychia.  Placed on oral and topical antibiotics.  He said minimal improvement.  No current drainage.  Patient is concerned that she is getting ready to go out of town.  No fever.  No chills.  No other associated symptoms.  No other complaints/concerns at this time.  PMH, Surgical Hx, Family Hx, Social History reviewed and updated as below.  Past Medical History:  Diagnosis Date  . Insomnia    Patient Active Problem List   Diagnosis Date Noted  . SUI (stress urinary incontinence, female) 07/14/2017   Past Surgical History:  Procedure Laterality Date  . ABDOMINAL HYSTERECTOMY    . BREAST BIOPSY Right 2010   neg  . LEG SURGERY Right    COMPARTMENT SYNDROME  . PUBOVAGINAL SLING N/A 08/09/2017   Procedure: URETHRAL SLING-TOT;  Surgeon: Linzie Collin, MD;  Location: [facility];  Service: Gynecology;  Laterality: N/A;   OB History    Gravida  1   Para  1   Term  1   Preterm      AB      Living  1     SAB      TAB      Ectopic      Multiple      Live Births  1          Home Medications    Prior to Admission medications   Medication Sig Start Date End Date Taking? Authorizing Provider  cetirizine (ZYRTEC) 10 MG tablet Take 10 mg by mouth daily.   Yes [provider]  hydrOXYzine (ATARAX/VISTARIL) 25 MG tablet Take 25 mg by mouth 3 (three) times daily as needed.   Yes [provider]  zolpidem (AMBIEN) 10 MG tablet TAKE ONE TABLET AT BEDTIME AS NEEDED FOR SLEEP. 04/12/18  Yes Shambley, Melody N, CNM  clindamycin (CLEOCIN T) 1 % external solution  11/11/17   [provider]    Dermatological Products, Misc. (HYLATOPIC PLUS) CREA Apply 1 Dose topically as needed.    [provider]  desoximetasone (TOPICORT) 0.25 % cream Apply 1 application topically as needed.    [provider]  Desoximetasone (TOPICORT) 0.25 % LIQD Apply 1 spray topically as needed (MOSTLY DURING WINTER MONTHS).    [provider]  doxycycline (VIBRAMYCIN) 100 MG capsule Take 1 capsule (100 mg total) by mouth 2 (two) times daily. 06/02/18   Tommie Sams, DO    Family History Family History  Problem Relation Age of Onset  . Dementia Mother   . Breast cancer Neg Hx   . Ovarian cancer Neg Hx     Social History Social History   Tobacco Use  . Smoking status: Never Smoker  . Smokeless tobacco: Never Used  Substance Use Topics  . Alcohol use: Yes    Comment: OCC  . Drug use: No     Allergies   Adhesive [tape]   Review of Systems Review of Systems  Constitutional: Negative.   Skin:       Paronychia.    Physical Exam Triage Vital Signs ED Triage Vitals  Enc Vitals Group     BP 06/02/18 1915 128/67     Pulse Rate 06/02/18 1915 63     Resp 06/02/18 1915 14     Temp 06/02/18 1915 98.2 F (36.8 C)     Temp Source 06/02/18 1915 Oral     SpO2 06/02/18 1915 98 %     Weight 06/02/18 1911 150 lb (68 kg)     Height 06/02/18 1911 5\' 3"  (1.6 m)     Head Circumference --      Peak Flow --      Pain Score 06/02/18 1911 8     Pain Loc --      Pain Edu? --      Excl. in GC? --    Updated Vital Signs BP 128/67 (BP Location: Left Arm)   Pulse 63   Temp 98.2 F (36.8 C) (Oral)   Resp 14   Ht 5\' 3"  (1.6 m)   Wt 68 kg   LMP 10/24/2013 Comment: Partial hysterectomy   SpO2 98%   BMI 26.57 kg/m   Visual Acuity Right Eye Distance:   Left Eye Distance:   Bilateral Distance:    Right Eye Near:   Left Eye Near:    Bilateral Near:     Physical Exam  Constitutional: She is oriented to person, place, and time. She appears well-developed. No distress.   HENT:  Head: Normocephalic and atraumatic.  Pulmonary/Chest: Effort normal. No respiratory distress.  Neurological: She is alert and oriented to person, place, and time.  Skin:  Right thumb -tenderness of the medial nailbed.  Mild swelling.  No appreciable fluctuance.  Psychiatric: She has a normal mood and affect. Her behavior is normal.  Nursing note and vitals reviewed.  UC Treatments / Results  Labs (all labs ordered are listed, but only abnormal results are displayed) Labs Reviewed - No data to display  EKG None  Radiology No results found.  Procedures Incision and Drainage Date/Time: 06/02/2018 9:08 PM Performed by: Tommie Sams, DO Authorized by: Tommie Sams, DO   Consent:    Consent obtained:  Verbal   Consent given by:  Patient Location:    Indications for incision and drainage: Paronychia.   Location:  Upper extremity   Upper extremity location:  Finger   Finger location:  R thumb Pre-procedure details:    Skin preparation:  Betadine Anesthesia (see MAR for exact dosages):    Anesthesia method:  Topical application   Topical anesthesia: Cold spray. Procedure type:    Complexity:  Simple Procedure details:    Incision types:  Stab incision   Scalpel blade:  11   Drainage:  Bloody   Drainage amount:  Scant Post-procedure details:    Patient tolerance of procedure:  Tolerated well, no immediate complications   (including critical care time)  Medications Ordered in UC Medications - No data to display  Initial Impression / Assessment and Plan / UC Course  I have reviewed the triage vital signs and the nursing notes.  Pertinent labs & imaging results that were available during my care of the patient were reviewed by me and considered in my medical decision making (see chart for details).    47 year old female presents with paronychia.  Drainage performed today per patient request.  Placing on doxycycline.  Supportive care.  Final Clinical  Impressions(s) / UC Diagnoses   Final diagnoses:  Paronychia of right thumb   Discharge Instructions   None    ED Prescriptions  Medication Sig Dispense Auth. Provider   doxycycline (VIBRAMYCIN) 100 MG capsule Take 1 capsule (100 mg total) by mouth 2 (two) times daily. 10 capsule Tommie Sams, DO     Controlled Substance Prescriptions [place] Controlled Substance Registry consulted? Not Applicable   Tommie Sams, DO 06/02/18 2110

## 2018-06-02 NOTE — ED Triage Notes (Signed)
Patient c/o right thumb pain that started a week ago.  Patient states that she was seen at [facility] on Monday and was started on an antibiotic.  Patient reports pain has not improved.

## 2018-06-08 ENCOUNTER — Other Ambulatory Visit: Payer: Self-pay | Admitting: Obstetrics and Gynecology

## 2018-06-08 DIAGNOSIS — Z1231 Encounter for screening mammogram for malignant neoplasm of breast: Secondary | ICD-10-CM

## 2018-06-13 DIAGNOSIS — L2089 Other atopic dermatitis: Secondary | ICD-10-CM | POA: Diagnosis not present

## 2018-07-06 ENCOUNTER — Ambulatory Visit
Admission: RE | Admit: 2018-07-06 | Discharge: 2018-07-06 | Disposition: A | Discharge status: Home / Self Care | Payer: BLUE CROSS/BLUE SHIELD | Source: Ambulatory Visit | Attending: Obstetrics and Gynecology | Admitting: Obstetrics and Gynecology

## 2018-07-06 DIAGNOSIS — Z1231 Encounter for screening mammogram for malignant neoplasm of breast: Secondary | ICD-10-CM | POA: Insufficient documentation

## 2018-08-08 ENCOUNTER — Other Ambulatory Visit: Payer: Self-pay | Admitting: Obstetrics and Gynecology

## 2018-08-17 ENCOUNTER — Ambulatory Visit (INDEPENDENT_AMBULATORY_CARE_PROVIDER_SITE_OTHER): Payer: BLUE CROSS/BLUE SHIELD | Admitting: Obstetrics and Gynecology

## 2018-08-17 ENCOUNTER — Encounter: Payer: Self-pay | Admitting: Obstetrics and Gynecology

## 2018-08-17 VITALS — BP 128/73 | HR 57 | Ht 63.0 in | Wt 151.9 lb

## 2018-08-17 DIAGNOSIS — Z01419 Encounter for gynecological examination (general) (routine) without abnormal findings: Secondary | ICD-10-CM

## 2018-08-17 DIAGNOSIS — N393 Stress incontinence (female) (male): Secondary | ICD-10-CM | POA: Diagnosis not present

## 2018-08-17 NOTE — Progress Notes (Signed)
Subjective:   Danielle Greene is a 47 y.o. G68P1001 Caucasian female here for a routine well-woman exam.  Patient's last menstrual period was 10/24/2013.    Current complaints: STILL LEAKING LARGE AMOUNTS OF URINE WITH EXERCISE; can't get pessary placed on her own. Has to restrict fluid intake on the days she exercises or it is worse. Is interested in pelvic floor PT.  Had rash on leg for 9 months- seen by dermatology and it is better, desires returning to vein specialist to treat spider veins and varicose veins PCP: me       does desire labs  Social History: Sexual: heterosexual Marital Status: married Living situation: with family Occupation: [employer] Tobacco/alcohol: no tobacco use Illicit drugs: no history of illicit drug use  The following portions of the patient's history were reviewed and updated as appropriate: allergies, current medications, past family history, past medical history, past social history, past surgical history and problem list.  Past Medical History Past Medical History:  Diagnosis Date  . Insomnia     Past Surgical History Past Surgical History:  Procedure Laterality Date  . ABDOMINAL HYSTERECTOMY    . BREAST EXCISIONAL BIOPSY Right 2010   neg  . LEG SURGERY Right    COMPARTMENT SYNDROME  . PUBOVAGINAL SLING N/A 08/09/2017   Procedure: URETHRAL SLING-TOT;  Surgeon: Linzie Collin, MD;  Location: [facility];  Service: Gynecology;  Laterality: N/A;    Gynecologic History [name]  Patient's last menstrual period was 10/24/2013. Contraception: status post hysterectomy Last Pap: 2017. Results were: normal Last mammogram: 07/2018. Results were: normal  Obstetric History OB History  Gravida Para Term Preterm AB Living  1 1 1     1   SAB TAB Ectopic Multiple Live Births          1    # Outcome Date GA Lbr Len/2nd Weight Sex Delivery Anes PTL Lv  1 Term 2000    M Vag-Spont   LIV    Current Medications Current Outpatient Medications on File  Prior to Visit  Medication Sig Dispense Refill  . zolpidem (AMBIEN) 10 MG tablet TAKE ONE TABLET AT BEDTIME AS NEEDED FOR SLEEP. 30 tablet 3  . cetirizine (ZYRTEC) 10 MG tablet Take 10 mg by mouth daily.    . hydrOXYzine (ATARAX/VISTARIL) 25 MG tablet Take 25 mg by mouth 3 (three) times daily as needed.     No current facility-administered medications on file prior to visit.     Review of Systems Patient denies any headaches, blurred vision, shortness of breath, chest pain, abdominal pain, problems with bowel movements, urination, or intercourse.  Objective:  BP 128/73   Pulse (!) 57   Ht 5\' 3"  (1.6 m)   Wt 151 lb 14.4 oz (68.9 kg)   LMP 10/24/2013 Comment: Partial hysterectomy   BMI 26.91 kg/m  Physical Exam  General:  Well developed, well nourished, no acute distress. She is alert and oriented x3. Skin:  Warm and dry Neck:  Midline trachea, no thyromegaly or nodules Cardiovascular: Regular rate and rhythm, no murmur heard Lungs:  Effort normal, all lung fields clear to auscultation bilaterally Breasts:  No dominant palpable mass, retraction, or nipple discharge Abdomen:  Soft, non tender, no hepatosplenomegaly or masses Pelvic:  External genitalia is normal in appearance.  The vagina is normal in appearance. The cervix is bulbous, no CMT.  Thin prep pap is not done. Uterus is surgically absent.  No adnexal masses or tenderness noted. Extremities:  No swelling or varicosities  noted Psych:  She has a normal mood and affect  Assessment:   Healthy well-woman exam Spider veins Stress incontinance   Plan:  Referred for pelvic floor PT Labs obtained and will follow up accordingly F/U 1 year for AE, or sooner if needed Mammogram up to date  Sally Menard Suzan NailerN Kaydyn Sayas, CNM

## 2018-08-18 ENCOUNTER — Other Ambulatory Visit: Payer: BLUE CROSS/BLUE SHIELD

## 2018-08-18 DIAGNOSIS — Z01419 Encounter for gynecological examination (general) (routine) without abnormal findings: Secondary | ICD-10-CM | POA: Diagnosis not present

## 2018-08-19 LAB — COMPREHENSIVE METABOLIC PANEL
ALT: 20 IU/L (ref 0–32)
AST: 24 IU/L (ref 0–40)
Albumin/Globulin Ratio: 1.5 (ref 1.2–2.2)
Albumin: 4 g/dL (ref 3.5–5.5)
Alkaline Phosphatase: 36 IU/L — ABNORMAL LOW (ref 39–117)
BILIRUBIN TOTAL: 0.5 mg/dL (ref 0.0–1.2)
BUN/Creatinine Ratio: 17 (ref 9–23)
BUN: 11 mg/dL (ref 6–24)
CHLORIDE: 100 mmol/L (ref 96–106)
CO2: 24 mmol/L (ref 20–29)
Calcium: 9.1 mg/dL (ref 8.7–10.2)
Creatinine, Ser: 0.66 mg/dL (ref 0.57–1.00)
GFR calc non Af Amer: 106 mL/min/{1.73_m2} (ref >59)
GFR, EST AFRICAN AMERICAN: 123 mL/min/{1.73_m2} (ref >59)
Globulin, Total: 2.6 g/dL (ref 1.5–4.5)
Glucose: 77 mg/dL (ref 65–99)
Potassium: 4.4 mmol/L (ref 3.5–5.2)
Sodium: 137 mmol/L (ref 134–144)
TOTAL PROTEIN: 6.6 g/dL (ref 6.0–8.5)

## 2018-08-19 LAB — LIPID PANEL
CHOL/HDL RATIO: 2.7 ratio (ref 0.0–4.4)
Cholesterol, Total: 182 mg/dL (ref 100–199)
HDL: 67 mg/dL (ref >39)
LDL Calculated: 97 mg/dL (ref 0–99)
Triglycerides: 90 mg/dL (ref 0–149)
VLDL Cholesterol Cal: 18 mg/dL (ref 5–40)

## 2018-08-19 LAB — HEMOGLOBIN A1C
Est. average glucose Bld gHb Est-mCnc: 100 mg/dL
Hgb A1c MFr Bld: 5.1 % (ref 4.8–5.6)

## 2018-08-19 LAB — TSH: TSH: 1.68 u[IU]/mL (ref 0.450–4.500)

## 2018-10-06 ENCOUNTER — Ambulatory Visit: Payer: BLUE CROSS/BLUE SHIELD

## 2018-10-13 ENCOUNTER — Ambulatory Visit: Payer: BLUE CROSS/BLUE SHIELD

## 2018-10-20 ENCOUNTER — Ambulatory Visit: Payer: BLUE CROSS/BLUE SHIELD

## 2018-10-27 ENCOUNTER — Ambulatory Visit: Payer: BLUE CROSS/BLUE SHIELD

## 2018-11-03 ENCOUNTER — Encounter: Payer: Self-pay | Admitting: Physical Therapy

## 2018-11-03 ENCOUNTER — Other Ambulatory Visit: Payer: Self-pay

## 2018-11-03 ENCOUNTER — Ambulatory Visit: Payer: BLUE CROSS/BLUE SHIELD | Attending: Obstetrics and Gynecology | Admitting: Physical Therapy

## 2018-11-03 DIAGNOSIS — M6281 Muscle weakness (generalized): Secondary | ICD-10-CM | POA: Insufficient documentation

## 2018-11-03 DIAGNOSIS — N393 Stress incontinence (female) (male): Secondary | ICD-10-CM | POA: Insufficient documentation

## 2018-11-03 DIAGNOSIS — R293 Abnormal posture: Secondary | ICD-10-CM | POA: Insufficient documentation

## 2018-11-03 DIAGNOSIS — R278 Other lack of coordination: Secondary | ICD-10-CM | POA: Diagnosis not present

## 2018-11-03 NOTE — Therapy (Signed)
[facility] MAIN [facility] SERVICES [address] [place], [place], [address] Phone: [phone]   Fax:  [phone]  Physical Therapy Treatment  Patient Details  Name: Danielle Greene MRN: [id number] Date of Birth: 14-Apr-1971 Referring Provider (PT): Melody [place], [place]   Encounter Date: 11/03/2018    PT End of Session - 11/04/18 0902    Visit Number  1    Number of Visits  13    Date for PT Re-Evaluation  01/27/19    Authorization Type  1/10 (IE: 11/03/2018)    PT Start Time  1706    PT Stop Time  1811    PT Time Calculation (min)  65 min    Activity Tolerance  Patient tolerated treatment well    Behavior During Therapy  [facility] for tasks assessed/performed        Past Medical History:  Diagnosis Date  . Insomnia     Past Surgical History:  Procedure Laterality Date  . ABDOMINAL HYSTERECTOMY    . BREAST EXCISIONAL BIOPSY Right 2010   neg  . LEG SURGERY Right    COMPARTMENT SYNDROME  . PUBOVAGINAL SLING N/A 08/09/2017   Procedure: URETHRAL SLING-TOT;  Surgeon: Linzie Collin, MD;  Location: [facility] ORS;  Service: Gynecology;  Laterality: N/A;    There were no vitals filed for this visit.    [facility] PT Assessment - 11/03/18 0001      Assessment   Medical Diagnosis  SUI    Referring Provider (PT)  Melody Aura Camps, CNM    Onset Date/Surgical Date  08/08/17    Hand Dominance  Right    Next MD Visit  Dec 2020    Prior Therapy  No      Precautions   Precautions  None      Balance Screen   Has the patient fallen in the past 6 months  No        Subjective Assessment - 11/04/18 0857    Subjective  Onset? 2015 patient began going to local gym for circuit workouts in a group and then she noticed during some runs having to leave the workout to urinate/wear a panty liner. Past 3 years she has been going to bootcamp classes at [employer], 3x a week. Last night, she states she saturated yourself. Patient also had a  hysterectomy in 2015 to deal with heavy bleeding and fibroid tumors. Patient does not have the urge to pee; patient reports that on days she works out she stops  drinking water 5 hours prior to the session in order to workout with an empty bladder. Leakage triggers? Sneezing (25% of the time), laughing (25% of the time), exercise (100%). Bladder habits? 15x/ day # of pads and type? menstrual pads. One pad only during exercise.  Amount of leakage? Moderate Bowel movements? Type 4/5. Patient has no pain, no straining. No pain with BM. Nocturia? 1-2x Fluid intake? Coffee 12-14 oz.; H20 64-80oz. Pain? w/ intercourse? No. w/ gynecological exam? No. What is the incontinence in the way of in your day-to-day? Exercising without restrictions    Pertinent History  Gynecological hx? 1 vaginal birth, grade 3 tear. 2015 abominal hysterectomy. 2018 pubovaginal sling-TOT (Patient reported never having symptom relief after sx) Current activities? Bootcamp 3x/week; work    Social worker;Walking;Other (comment)   unable to participate fully in wellness activities   Patient Stated Goals  To control urine, decrease pad usage    Currently in Pain?  No/denies  Red Flags: Denies all Have you had any night sweats? Unexplained weight loss? Saddle anesthesia? Unexplained changes in bowel or bladder habits?  OBJECTIVE  Posture/Observations:  Sitting: Sits with legs crossed (L over right predominantly), slightly slumped, but largely unremarkable for significant postural deviations. Standing: L ASIS is anteriorly rotated, B shoulders resting in IR, thoracic spine is L rotated as indicated by R shoulder sitting anterior to R hip and with respect to L shoulder. Rib flare is present suggesting decreased use of front abdominal wall in maintaining posture, possible difficulty with diaphragmatic excursion during quiet inspiration, and possibly prolonged elevation of stress/anxiety state.  Palpation/Segmental  Motion/Joint Play: Non-painful and stiff throughout spine with greatest stiffness noted in thoracolumbar region.   Special tests:   FADDIR (-) B FABER (-) B Hip Scour (-) B  Range of Motion/Flexibilty:  Spine: All WNL and roughly symmetrical. Patient unable to dissociate thoracic movement from lumbopelvic. Patient has forward flexion compensations occurring during end range lateral flexion indicating decreased body awareness and coordination of isolated movement patterns. Hips: postural compensations for end ranges, but all WFL. Hip adductors have limited extensibility during PROM and significant TTP of B hip adductors from origin to insertion, R >L.    Strength/MMT:  LE MMT   RLE LLE  Hip Extension 4/5 4/5  Glute Max 4-/5 3+/5  Hip Flexion 5/5 5/5  Hip Abduction (seated) 5/5 5/5  Hip Adduction (seated) 5/5 5/5  Hip ER (seated) 4/5 4/5  Hip IR (seated) 4/5 4/5  Knee Extension 5/5 5/5  Knee Flexion 4-/5 3+/5  Dorsiflexion  5/5 5/5  Plantarflexion (seated) 5/5 5/5    Abdominal:  Palpation: no TTP noted Diastasis: deferred 2/2 to extensive history taking and education interventions to improve patient buy-in to pelvic floor PT. Diaphragmatic Breathing: patient unable to coordinate  Pelvic Floor External Exam: Patient was educated on the approach and was advised that she could stop the examination at any point. Patient was asked for consent. Patient provided verbal consent. Palpation: no TTP to superficial musculature; increased tone noted bilaterally R>L. Cough: no appreciable response from PFM Coordination: patient unable to lengthen in response to tactile cueing, verbal cueing, and breath cues. Patient achieved minimal contraction of short duration (2 sec) hold with noted gluteal compensations   Internal Vaginal Exam: deferred at this date 2/2 to time and patient comfort Introitus Appears:  Skin integrity:  Strength (PERF):  Symmetry: Palpation: Prolapse: Scar  mobility:  Internal Rectal Exam: deferred at this date 2/2 to time and patient comfort Strength (PERF): Symmetry: Palpation: Prolapse:   Gait Analysis: No gross abnormalities of gait. Patient has mildly decreased rotational components of the pelvis during gait.   Pelvic Floor Outcome Measures: ICIQ-UI 15/21; UIQ-7 19%   Patient educated on prognosis, POC, and provided with HEP including: limiting bladder irritation, hip adductor stretch. Patient articulated understanding and returned demonstration. Patient will benefit from further education in order to maximize compliance and understanding for long-term therapeutic gains.   TREATMENT  Neuromuscular Re-education: Diaphragmatic breathing with multi-modal cueing in preparation for PFM training and for improved management of IAP and decreased incidence on SUI Hip adductor stretch, "butterfly stretch", prolonged positioning with diaphragmatic breathing for decreased tension contributions to PFM  Patient educated on bladder irritants, PFM functions, and IAP system. Further education is needed.  Patient Response to interventions: Patient reported confidence in ability to perform butterfly stretch as HEP. Patient had difficulty coordinated diaphragmatic breathing pattern with accessory muscles and chest breathing being the patient's default.  Patient expressed difficulty conceptualizing diaphragmatic breathing.  ASSESSMENT Patient is a lively 48 year old presenting to clinic with chief complaints of SUI with moderate leakage which impacts her ability to participate in physical wellness activities as well as contributing to her personal distress. Upon examination, patient demonstrates deficits in strength, coordination, IAP management, posture, and tissue extensibility as evidenced by posterior chain strength deficits on MMT (grossly 4/5 BLE), inability to coordinate PFM lengthening with cueing, inability to achieve sustained PFM contraction with  cueing for greater than 2 seconds with noted gluteal compensations present, TTP with noted increase tone in B hip adductors, and anteriorly rotated L ASIS as well as L thoracic rotation. Patient's responses on ICIQ-UI Short Form outcome measure (15/21) indicate moderate functional limitations/disability/distress. Patient's progress may be limited due to decreased confidence in PT interventions as well as consistent participation in high-intensity physical activity for stress and health management; however, patient's commitment to health, willingness to consider diagnosis education, and social support is advantageous. Patient was able to achieve improved understanding of bladder irritation management during today's evaluation and responded positively to neuromuscular re-education interventions. Patient will benefit from continued skilled therapeutic intervention to address deficits in strength, coordination, IAP management, posture, and tissue extensibility in order to decrease incidence of SUI, increase function, and improve overall QOL.    PT Short Term Goals - 11/04/18 0913      PT SHORT TERM GOAL #1   Title  Patient will demonstrate independence with HEP for improved therapeutic outcomes and decreased incidence of SUI.    Baseline  IE: not initiated    Time  6    Period  Weeks    Status  New    Target Date  12/16/18      PT SHORT TERM GOAL #2   Title  Patient will demonstrate coordinated diaphragmatic breathing with appropriate PFM lengthening and contraction independent of verbal and tactile cueing in order to improve continence during physical activity.    Baseline  IE: unable to coordinate    Time  6    Period  Weeks    Status  New    Target Date  12/16/18        PT Long Term Goals - 11/04/18 0916      PT LONG TERM GOAL #1   Title  Patient will achieve a score of less than or equal to 10/21 on the ICIQ-UI in order to demonstrate a clinically significant improvement in urinary  incontinence.    Baseline  IE: 15/21    Time  12    Period  Weeks    Status  New    Target Date  01/27/19      PT LONG TERM GOAL #2   Title  Patient will achieve a score of 0% on the UIQ-7 outcome measure to indicate complete resolution of SUI and return to PLOF without restriction.    Baseline  IE: 19%    Time  12    Period  Weeks    Status  New    Target Date  01/27/19      PT LONG TERM GOAL #3   Title  Patient will demonstrated coordinated PFM contraction with circumferential squeeze and sequential lift for endurance of 10 seconds 10 x and strength of 10 quick flicks for improved continence during physical activity.    Baseline  IE: external exam, endurance hold 2 sec, not able to reporduce on cueing    Time  12    Period  Weeks    Status  New    Target Date  01/27/19      PT LONG TERM GOAL #4   Title  Patient will report no to minimal leakage during bootcamp exercise class with no bathroom breaks during the 1 hour class in order to demonstrate full and unrestricted participation in physcial wellness activities.    Baseline  IE: 1-2 bathroom breaks, moderate to large leakage    Time  12    Period  Weeks    Status  New    Target Date  01/27/19         Plan - 11/04/18 3295    Clinical Impression Statement  Patient is a lively 48 year old presenting to clinic with chief complaints of SUI with moderate leakage which impacts her ability to participate in physical wellness activities as well as contributing to her personal distress. Upon examination, patient demonstrates deficits in strength, coordination, IAP management, posture, and tissue extensibility as evidenced by posterior chain strength deficits on MMT (grossly 4/5 BLE), inability to coordinate PFM lengthening with cueing, inability to achieve sustained PFM contraction with cueing for greater than 2 seconds with noted gluteal compensations present, TTP with noted increase tone in B hip adductors, and anteriorly rotated L  ASIS as well as L thoracic rotation. Patient's responses on ICIQ-UI Short Form outcome measure (15/21) indicate moderate functional limitations/disability/distress. Patient's progress may be limited due to decreased confidence in PT interventions as well as consistent participation in high-intensity physical activity for stress and health management; however, patient's commitment to health, willingness to consider diagnosis education, and social support is advantageous. Patient was able to achieve improved understanding of bladder irritation management during today's evaluation and responded positively to neuromuscular re-education interventions. Patient will benefit from continued skilled therapeutic intervention to address deficits in strength, coordination, IAP management, posture, and tissue extensibility in order to decrease incidence of SUI, increase function, and improve overall QOL.    Personal Factors and Comorbidities  Behavior Pattern;Time since onset of injury/illness/exacerbation;Fitness;Sex;Other;Past/Current Experience;Comorbidity 1;Age   s/p hysterectomy, s/p failed pubovaginal sling, decreased confidence in PT's ability to help, hx grade 3 tearing during vaginal delivery   Comorbidities  Insomnia    Examination-Activity Limitations  Squat;Lift;Locomotion Level;Carry;Continence    Examination-Participation Restrictions  Community Activity;Yard Work;Interpersonal Relationship;Other   Unable to participate fully in physical wellness activities   Stability/Clinical Decision Making  Evolving/Moderate complexity    Clinical Decision Making  Moderate    Rehab Potential  Fair    PT Frequency  1x / week    PT Duration  12 weeks    PT Treatment/Interventions  ADLs/Self Care Home Management;Aquatic Therapy;Biofeedback;Cryotherapy;Electrical Stimulation;Moist Heat;Gait training;Stair training;Functional mobility training;Therapeutic activities;Therapeutic exercise;Neuromuscular re-education;Manual  techniques;Patient/family education;Scar mobilization;Dry needling;Taping    PT Next Visit Plan  Assess DRAM, diaphragmatic breathing, education on bladder-brain loops    PT Home Exercise Plan  Hip Adductor Butterfly stretch    Consulted and Agree with Plan of Care  Patient        Patient will benefit from skilled therapeutic intervention in order to improve the following deficits and impairments:  Improper body mechanics, Postural dysfunction, Increased muscle spasms, Decreased coordination, Decreased endurance, Decreased activity tolerance, Decreased strength, Impaired flexibility, Decreased mobility, Decreased scar mobility, Decreased range of motion  Visit Diagnosis: Other lack of coordination  Muscle weakness (generalized)  Abnormal posture  SUI (stress urinary incontinence, female)     Problem List Patient Active Problem List   Diagnosis Date Noted  . SUI (stress  urinary incontinence, female) 07/14/2017   Sheria Lang PT, DPT [phone] 11/03/2018, 5:18 PM  [facility] MAIN [facility] SERVICES [address] [place], [place], [address] Phone: [phone]   Fax:  [phone]  Name: Danielle Greene MRN: [id number] Date of Birth: 1971/01/07

## 2018-11-04 NOTE — Patient Instructions (Signed)
Tips for Success Week of 11/03/2018  1. Have a glass of water prior to drinking coffee in the morning. 2. Drink 8 oz of water/ hour when at work. 3. Complete bladder diary.  4. Hip Adductor Stretch (Butterfly stretch). Sit in this position with the legs supported by pillows as often as you can tolerate. Relax here.

## 2018-11-08 ENCOUNTER — Encounter: Payer: Self-pay | Admitting: Physical Therapy

## 2018-11-08 ENCOUNTER — Ambulatory Visit: Payer: BLUE CROSS/BLUE SHIELD | Admitting: Physical Therapy

## 2018-11-08 DIAGNOSIS — R293 Abnormal posture: Secondary | ICD-10-CM | POA: Diagnosis not present

## 2018-11-08 DIAGNOSIS — R278 Other lack of coordination: Secondary | ICD-10-CM

## 2018-11-08 DIAGNOSIS — N393 Stress incontinence (female) (male): Secondary | ICD-10-CM | POA: Diagnosis not present

## 2018-11-08 DIAGNOSIS — M6281 Muscle weakness (generalized): Secondary | ICD-10-CM | POA: Diagnosis not present

## 2018-11-09 NOTE — Therapy (Signed)
[place] MA[facility] SERVICES [place], [place], [address] Phone: [phone]   Fax:  [phone]  Physical Therapy Treatment  Patient Details  Name: Danielle Greene MRN: [id number] Date of Birth: 10-Apr-1971 Referring Provider (PT): Melody Madison, [place]   Encounter Date: 11/08/2018  PT End of Session - 11/08/18 1702    Visit Number  2    Number of Visits  13    Date for PT Re-Evaluation  01/27/19    Authorization Type  2/10 (IE: 11/03/2018)    PT Start Time  1704    PT Stop Time  1800    PT Time Calculation (min)  56 min    Activity Tolerance  Patient tolerated treatment well    Behavior During Therapy  [facility] for tasks assessed/performed       Past Medical History:  Diagnosis Date  . Insomnia     Past Surgical History:  Procedure Laterality Date  . ABDOMINAL HYSTERECTOMY    . BREAST EXCISIONAL BIOPSY Right 2010   neg  . LEG SURGERY Right    COMPARTMENT SYNDROME  . PUBOVAGINAL SLING N/A 08/09/2017   Procedure: URETHRAL SLING-TOT;  Surgeon: Harlin Heys, MD;  Location: [facility] ORS;  Service: Gynecology;  Laterality: N/A;    There were no vitals filed for this visit.  Subjective Assessment - 11/08/18 1704    Subjective  [name] reports that she has been very busy at work and found it difficult to complete the bladder diary. She states that she has been trying to drink water more consistently throughout the day and didn't notice any negative impacts on her leakage during exercise. She states that after her Friday workout she has noticed some L hip/glute pain.     Pertinent History  Gynecological hx? 1 vaginal birth, grade 3 tear. 2015 abominal hysterectomy. 2018 pubovaginal sling-TOT (Patient reported never having symptom relief after sx) Current activities? Bootcamp 3x/week; work    IT consultant;Walking;Other (comment)   unable to participate fully in wellness activities   Patient Stated Goals  To control  urine, decrease pad usage    Currently in Pain?  Yes    Pain Score  4     Pain Location  Buttocks    Pain Orientation  Left;Mid;Posterior;Lateral    Pain Descriptors / Indicators  Sore;Tightness;Aching    Pain Onset  In the past 7 days       TREATMENT  Manual Therapy: STM, TPR to L glute complex MET for L hip flexor 10 sec on: 30 sec off x4  Neuromuscular Re-education: Supine diaphragmatic breathing, with cueing for prolonged exhalation; patient's rib flare and increased thoracic extension requires more consistent cueing and tactile feedback.  Supine diaphragmatic breathing with PFM lengthening, VCs and TCs for sequence and coordination. Patient has minimal and inconsistent lengthening, but lengthening is appreciable.   Patient performed supine glute release with tennis ball and HLTR for decreased muscle tone and pain modulation. Patient able to demonstrate with minimal cueing and was provided with activity as part of HEP.  Patient educated extensively on bladder-brain function and communication with respect to urinary frequency habits and urinary urgency. Patient articulated understanding and agreed to plan to suppress urges for 15-30 minutes as tolerated with tools such as distraction and diaphragmatic breathing. Patient encouraged to sit with feet in full contact on the floor without legs crossed to decrease tension contributions to the PFM. Patient expressed decreased confidence in her ability to make this postural adjustment,  but expressed willingness to try.  Patient educated throughout session on appropriate technique and form using multi-modal cueing, HEP, and activity modification. Patient articulated understanding and returned demonstration.  Patient Response to interventions: Patient reported decreased tenderness in L glute/hip complex, but continues to note some spots that are more aggravated. Patient states confidence in ability to delay urination to 75-90  minutes.  ASSESSMENT Patient presents to clinic with deficits in posture, pain, PFM strength and coordination as evidenced by inconsistent ability to lengthen PFM with cueing and breath, increased rib flare and thoracic extension in supine, increased hip adduction in sitting, and TTP of L glute complex. Patient able to achieve diaphragmatic breath consistently during today's session and responded positively to manual interventions. Patient will benefit from continued skilled therapeutic intervention to address remaining deficits in posture, pain, PFM strength and coordination in order to decrease incidence of SUI, increase function, and improve overall QOL.    PT Short Term Goals - 11/04/18 0913      PT SHORT TERM GOAL #1   Title  Patient will demonstrate independence with HEP for improved therapeutic outcomes and decreased incidence of SUI.    Baseline  IE: not initiated    Time  6    Period  Weeks    Status  New    Target Date  12/16/18      PT SHORT TERM GOAL #2   Title  Patient will demonstrate coordinated diaphragmatic breathing with appropriate PFM lengthening and contraction independent of verbal and tactile cueing in order to improve continence during physical activity.    Baseline  IE: unable to coordinate    Time  6    Period  Weeks    Status  New    Target Date  12/16/18        PT Long Term Goals - 11/04/18 0916      PT LONG TERM GOAL #1   Title  Patient will achieve a score of less than or equal to 10/21 on the ICIQ-UI in order to demonstrate a clinically significant improvement in urinary incontinence.    Baseline  IE: 15/21    Time  12    Period  Weeks    Status  New    Target Date  01/27/19      PT LONG TERM GOAL #2   Title  Patient will achieve a score of 0% on the UIQ-7 outcome measure to indicate complete resolution of SUI and return to PLOF without restriction.    Baseline  IE: 19%    Time  12    Period  Weeks    Status  New    Target Date  01/27/19       PT LONG TERM GOAL #3   Title  Patient will demonstrated coordinated PFM contraction with circumferential squeeze and sequential lift for endurance of 10 seconds 10 x and strength of 10 quick flicks for improved continence during physical activity.    Baseline  IE: external exam, endurance hold 2 sec, not able to reporduce on cueing    Time  12    Period  Weeks    Status  New    Target Date  01/27/19      PT LONG TERM GOAL #4   Title  Patient will report no to minimal leakage during bootcamp exercise class with no bathroom breaks during the 1 hour class in order to demonstrate full and unrestricted participation in [place] wellness activities.    Baseline  IE:  1-2 bathroom breaks, moderate to large leakage    Time  12    Period  Weeks    Status  New    Target Date  01/27/19        Plan - 11/09/18 1028    Clinical Impression Statement  Patient presents to clinic with deficits in posture, pain, PFM strength and coordination as evidenced by inconsistent ability to lengthen PFM with cueing and breath, increased rib flare and thoracic extension in supine, increased hip adduction in sitting, and TTP of L glute complex. Patient able to achieve diaphragmatic breath consistently during today's session and responded positively to manual interventions. Patient will benefit from continued skilled therapeutic intervention to address remaining deficits in posture, pain, PFM strength and coordination in order to decrease incidence of SUI, increase function, and improve overall QOL.    Personal Factors and Comorbidities  Behavior Pattern;Time since onset of injury/illness/exacerbation;Fitness;Sex;Other;Past/Current Experience;Comorbidity 1;Age   s/p hysterectomy, s/p failed pubovaginal sling, decreased confidence in PT's ability to help, hx grade 3 tearing during vaginal delivery   Comorbidities  Insomnia    Examination-Activity Limitations  Squat;Lift;Locomotion Level;Carry;Continence     Examination-Participation Restrictions  Community Activity;Yard Work;Interpersonal Relationship;Other   Unable to participate fully in physical wellness activities   Stability/Clinical Decision Making  Evolving/Moderate complexity    Rehab Potential  Fair    PT Frequency  1x / week    PT Duration  12 weeks    PT Treatment/Interventions  ADLs/Self Care Home Management;Aquatic Therapy;Biofeedback;Cryotherapy;Electrical Stimulation;Moist Heat;Gait training;Stair training;Functional mobility training;Therapeutic activities;Therapeutic exercise;Neuromuscular re-education;Manual techniques;Patient/family education;Scar mobilization;Dry needling;Taping    PT Next Visit Plan  diaphragmatic breathing, bootcamp modifications, deep core phase 1    PT Home Exercise Plan  glute release, diaphragmatic breathing    Consulted and Agree with Plan of Care  Patient       Patient will benefit from skilled therapeutic intervention in order to improve the following deficits and impairments:  Improper body mechanics, Postural dysfunction, Increased muscle spasms, Decreased coordination, Decreased endurance, Decreased activity tolerance, Decreased strength, Impaired flexibility, Decreased mobility, Decreased scar mobility, Decreased range of motion  Visit Diagnosis: Other lack of coordination  Muscle weakness (generalized)  Abnormal posture  SUI (stress urinary incontinence, female)     Problem List Patient Active Problem List   Diagnosis Date Noted  . SUI (stress urinary incontinence, female) 07/14/2017   Myles Gip PT, DPT [phone] 11/09/2018, 10:30 AM  [place] MAIN [facility] SERVICES [place], [place], [address] Phone: [phone]   Fax:  [phone]  Name: Danielle Greene MRN: [id number] Date of Birth: 25-Jun-1971

## 2018-11-17 ENCOUNTER — Encounter: Payer: Self-pay | Admitting: Physical Therapy

## 2018-11-17 ENCOUNTER — Ambulatory Visit: Payer: BLUE CROSS/BLUE SHIELD | Admitting: Physical Therapy

## 2018-11-17 ENCOUNTER — Other Ambulatory Visit: Payer: Self-pay

## 2018-11-17 DIAGNOSIS — N393 Stress incontinence (female) (male): Secondary | ICD-10-CM | POA: Diagnosis not present

## 2018-11-17 DIAGNOSIS — M6281 Muscle weakness (generalized): Secondary | ICD-10-CM

## 2018-11-17 DIAGNOSIS — R293 Abnormal posture: Secondary | ICD-10-CM

## 2018-11-17 DIAGNOSIS — R278 Other lack of coordination: Secondary | ICD-10-CM

## 2018-11-17 NOTE — Patient Instructions (Signed)
Apps for mindfulness and stress reduction:       Continue with your breathing exercises and really commit to managing stress for the next few weeks to decrease the overall tension in your system.

## 2018-11-21 NOTE — Therapy (Signed)
[place] [facility] MAIN [facility] SERVICES [address] [place], [place], [address] Phone: [phone]   Fax:  [phone]  Physical Therapy Treatment  Patient Details  Name: Danielle Greene MRN: [id number] Date of Birth: 1971-01-22 Referring Provider (PT): Melody [place], [place]   Encounter Date: 11/17/2018  PT End of Session - 11/21/18 3832    Visit Number  3    Number of Visits  13    Date for PT Re-Evaluation  01/27/19    Authorization Type  3/10 (IE: 11/03/2018)    PT Start Time  1708    PT Stop Time  1748    PT Time Calculation (min)  40 min    Activity Tolerance  Patient tolerated treatment well    Behavior During Therapy  [facility] for tasks assessed/performed       Past Medical History:  Diagnosis Date  . Insomnia     Past Surgical History:  Procedure Laterality Date  . ABDOMINAL HYSTERECTOMY    . BREAST EXCISIONAL BIOPSY Right 2010   neg  . LEG SURGERY Right    COMPARTMENT SYNDROME  . PUBOVAGINAL SLING N/A 08/09/2017   Procedure: URETHRAL SLING-TOT;  Surgeon: Linzie Collin, MD;  Location: [facility] ORS;  Service: Gynecology;  Laterality: N/A;    There were no vitals filed for this visit.  SUBJECTIVE Patient reports that she still attended bootcamp last night. Patient reports that she leaked less last night. She is preferencing the poise pads because they eliminate the heavy wetness. Patient is able to hold 90 minutes in the morning and in the afternoon, but has trouble waiting 90 minutes multiple times in a row.  Denies pain in hip/buttocks. Reports that it resolved after last session.   TREATMENT Neuromuscular Re-education: Patient educated on PFM and nervous system relationship with respect to stress, trauma, and tension. Patient educated on various apps and approaches for nervous system down-regulation. Supine hooklying diaphragmatic breathing with 2# cuff weight, minimal VCs for initial repetitions Supine hooklying,  diaphragmatic breathing. Patient requires max verbal cues without tactile feedback.   Patient educated throughout session on appropriate technique and form using multi-modal cueing, HEP, and activity modification. Patient articulated understanding and returned demonstration.  Patient Response to interventions: Patient reported feeling more calm after session.  ASSESSMENT Patient presents to clinic with excellent motivation to participate in therapy. Patient demonstrates deficits in posture, pain, PFM strength and coordination as evidenced by frequency of urinary incontinence with activity, bilateral rib flare with decreased upper abdominal engagement, and difficulty coordinating lengthening of PFM. Patient able to achieve diaphragmatic breath with minimal verbal cues and benefited from tactile feedback via 2# cuff weight on lower abdomen during today's session and responded positively to educational interventions. Patient will benefit from continued skilled therapeutic intervention to address remaining deficits in posture, pain, PFM strength and coordination in order to decrease incidence of SUI, increase function, and improve overall QOL.     PT Short Term Goals - 11/04/18 0913      PT SHORT TERM GOAL #1   Title  Patient will demonstrate independence with HEP for improved therapeutic outcomes and decreased incidence of SUI.    Baseline  IE: not initiated    Time  6    Period  Weeks    Status  New    Target Date  12/16/18      PT SHORT TERM GOAL #2   Title  Patient will demonstrate coordinated diaphragmatic breathing with appropriate PFM lengthening and contraction independent  of verbal and tactile cueing in order to improve continence during physical activity.    Baseline  IE: unable to coordinate    Time  6    Period  Weeks    Status  New    Target Date  12/16/18        PT Long Term Goals - 11/04/18 0916      PT LONG TERM GOAL #1   Title  Patient will achieve a score of less  than or equal to 10/21 on the ICIQ-UI in order to demonstrate a clinically significant improvement in urinary incontinence.    Baseline  IE: 15/21    Time  12    Period  Weeks    Status  New    Target Date  01/27/19      PT LONG TERM GOAL #2   Title  Patient will achieve a score of 0% on the UIQ-7 outcome measure to indicate complete resolution of SUI and return to PLOF without restriction.    Baseline  IE: 19%    Time  12    Period  Weeks    Status  New    Target Date  01/27/19      PT LONG TERM GOAL #3   Title  Patient will demonstrated coordinated PFM contraction with circumferential squeeze and sequential lift for endurance of 10 seconds 10 x and strength of 10 quick flicks for improved continence during physical activity.    Baseline  IE: external exam, endurance hold 2 sec, not able to reporduce on cueing    Time  12    Period  Weeks    Status  New    Target Date  01/27/19      PT LONG TERM GOAL #4   Title  Patient will report no to minimal leakage during bootcamp exercise class with no bathroom breaks during the 1 hour class in order to demonstrate full and unrestricted participation in physcial wellness activities.    Baseline  IE: 1-2 bathroom breaks, moderate to large leakage    Time  12    Period  Weeks    Status  New    Target Date  01/27/19        Plan - 11/21/18 0830    Clinical Impression Statement  Patient presents to clinic with excellent motivation to participate in therapy. Patient demonstrates deficits in posture, pain, PFM strength and coordination as evidenced by frequency of urinary incontinence with activity, bilateral rib flare with decreased upper abdominal engagement, and difficulty coordinating lengthening of PFM. Patient able to achieve diaphragmatic breath with minimal verbal cues and benefited from tactile feedback via 2# cuff weight on lower abdomen during today's session and responded positively to educational interventions. Patient will benefit  from continued skilled therapeutic intervention to address remaining deficits in posture, pain, PFM strength and coordination in order to decrease incidence of SUI, increase function, and improve overall QOL.    Personal Factors and Comorbidities  Behavior Pattern;Time since onset of injury/illness/exacerbation;Fitness;Sex;Other;Past/Current Experience;Comorbidity 1;Age   s/p hysterectomy, s/p failed pubovaginal sling, decreased confidence in PT's ability to help, hx grade 3 tearing during vaginal delivery   Comorbidities  Insomnia    Examination-Activity Limitations  Squat;Lift;Locomotion Level;Carry;Continence    Examination-Participation Restrictions  Community Activity;Yard Work;Interpersonal Relationship;Other   Unable to participate fully in physical wellness activities   Stability/Clinical Decision Making  Evolving/Moderate complexity    Rehab Potential  Fair    PT Frequency  1x / week  PT Duration  12 weeks    PT Treatment/Interventions  ADLs/Self Care Home Management;Aquatic Therapy;Biofeedback;Cryotherapy;Electrical Stimulation;Moist Heat;Gait training;Stair training;Functional mobility training;Therapeutic activities;Therapeutic exercise;Neuromuscular re-education;Manual techniques;Patient/family education;Scar mobilization;Dry needling;Taping    PT Next Visit Plan  diaphragmatic breathing, bootcamp modifications, deep core phase 1    PT Home Exercise Plan  diaphragmatic breathing, nervous system down-regulation via meditation apps    Consulted and Agree with Plan of Care  Patient       Patient will benefit from skilled therapeutic intervention in order to improve the following deficits and impairments:  Improper body mechanics, Postural dysfunction, Increased muscle spasms, Decreased coordination, Decreased endurance, Decreased activity tolerance, Decreased strength, Impaired flexibility, Decreased mobility, Decreased scar mobility, Decreased range of motion  Visit Diagnosis: Other  lack of coordination  Muscle weakness (generalized)  Abnormal posture  SUI (stress urinary incontinence, female)     Problem List Patient Active Problem List   Diagnosis Date Noted  . SUI (stress urinary incontinence, female) 07/14/2017   Sheria Lang PT, DPT [phone] 11/21/2018, 8:23 AM  [place] [facility] MAIN [facility] SERVICES [address] [place], [place], [address] Phone: [phone]   Fax:  [phone]  Name: REBEKA LEVON MRN: [id number] Date of Birth: 27-Dec-1970

## 2019-01-11 DIAGNOSIS — I8312 Varicose veins of left lower extremity with inflammation: Secondary | ICD-10-CM | POA: Diagnosis not present

## 2019-02-01 ENCOUNTER — Other Ambulatory Visit: Payer: Self-pay | Admitting: Obstetrics and Gynecology

## 2019-02-01 DIAGNOSIS — R232 Flushing: Secondary | ICD-10-CM

## 2019-02-03 ENCOUNTER — Other Ambulatory Visit: Payer: BC Managed Care – PPO

## 2019-02-03 ENCOUNTER — Other Ambulatory Visit: Payer: Self-pay

## 2019-02-03 DIAGNOSIS — R232 Flushing: Secondary | ICD-10-CM

## 2019-02-04 LAB — FOLLICLE STIMULATING HORMONE: FSH: 71.8 m[IU]/mL

## 2019-02-08 ENCOUNTER — Other Ambulatory Visit: Payer: Self-pay | Admitting: Obstetrics and Gynecology

## 2019-02-08 ENCOUNTER — Telehealth: Payer: Self-pay | Admitting: Obstetrics and Gynecology

## 2019-02-08 MED ORDER — ESTRADIOL 0.5 MG PO TABS: 0.5000 mg | ORAL_TABLET | Freq: Every day | ORAL | 6 refills | Status: DC

## 2019-02-08 NOTE — Telephone Encounter (Signed)
Pt is having severe hot flashes at night, IS NOT SLEEPING at all!!!

## 2019-02-08 NOTE — Telephone Encounter (Signed)
The patient called and stated that she sent a message to Melody yesterday and she just wanted to follow up with her. Please advise.

## 2019-03-06 DIAGNOSIS — Z86018 Personal history of other benign neoplasm: Secondary | ICD-10-CM | POA: Diagnosis not present

## 2019-03-06 DIAGNOSIS — D239 Other benign neoplasm of skin, unspecified: Secondary | ICD-10-CM | POA: Diagnosis not present

## 2019-03-06 DIAGNOSIS — L578 Other skin changes due to chronic exposure to nonionizing radiation: Secondary | ICD-10-CM | POA: Diagnosis not present

## 2019-03-06 DIAGNOSIS — D229 Melanocytic nevi, unspecified: Secondary | ICD-10-CM | POA: Diagnosis not present

## 2019-04-08 ENCOUNTER — Other Ambulatory Visit: Payer: Self-pay | Admitting: Obstetrics and Gynecology

## 2019-04-10 ENCOUNTER — Other Ambulatory Visit: Payer: Self-pay

## 2019-04-10 MED ORDER — ZOLPIDEM TARTRATE 10 MG PO TABS: ORAL_TABLET | ORAL | 3 refills | Status: DC

## 2019-04-10 NOTE — Telephone Encounter (Signed)
Script printed and awaiting sig

## 2019-04-11 ENCOUNTER — Other Ambulatory Visit: Payer: Self-pay | Admitting: Obstetrics and Gynecology

## 2019-04-11 DIAGNOSIS — Z1231 Encounter for screening mammogram for malignant neoplasm of breast: Secondary | ICD-10-CM

## 2019-05-22 DIAGNOSIS — M25552 Pain in left hip: Secondary | ICD-10-CM | POA: Insufficient documentation

## 2019-05-23 DIAGNOSIS — M25552 Pain in left hip: Secondary | ICD-10-CM | POA: Diagnosis not present

## 2019-06-16 DIAGNOSIS — I8312 Varicose veins of left lower extremity with inflammation: Secondary | ICD-10-CM | POA: Diagnosis not present

## 2019-06-20 DIAGNOSIS — I8312 Varicose veins of left lower extremity with inflammation: Secondary | ICD-10-CM | POA: Diagnosis not present

## 2019-06-20 DIAGNOSIS — M79605 Pain in left leg: Secondary | ICD-10-CM | POA: Diagnosis not present

## 2019-07-01 DIAGNOSIS — Z23 Encounter for immunization: Secondary | ICD-10-CM | POA: Diagnosis not present

## 2019-07-11 DIAGNOSIS — I8312 Varicose veins of left lower extremity with inflammation: Secondary | ICD-10-CM | POA: Diagnosis not present

## 2019-07-25 ENCOUNTER — Ambulatory Visit
Admission: RE | Admit: 2019-07-25 | Discharge: 2019-07-25 | Disposition: A | Discharge status: Home / Self Care | Payer: BC Managed Care – PPO | Source: Ambulatory Visit | Attending: Obstetrics and Gynecology | Admitting: Obstetrics and Gynecology

## 2019-07-25 DIAGNOSIS — Z1231 Encounter for screening mammogram for malignant neoplasm of breast: Secondary | ICD-10-CM | POA: Insufficient documentation

## 2019-08-08 DIAGNOSIS — M7981 Nontraumatic hematoma of soft tissue: Secondary | ICD-10-CM | POA: Diagnosis not present

## 2019-08-08 DIAGNOSIS — I8312 Varicose veins of left lower extremity with inflammation: Secondary | ICD-10-CM | POA: Diagnosis not present

## 2019-08-23 ENCOUNTER — Encounter: Payer: BLUE CROSS/BLUE SHIELD | Admitting: Obstetrics and Gynecology

## 2019-08-23 ENCOUNTER — Encounter: Payer: Self-pay | Admitting: Certified Nurse Midwife

## 2019-08-23 ENCOUNTER — Ambulatory Visit (INDEPENDENT_AMBULATORY_CARE_PROVIDER_SITE_OTHER): Payer: BC Managed Care – PPO | Admitting: Certified Nurse Midwife

## 2019-08-23 ENCOUNTER — Other Ambulatory Visit: Payer: Self-pay

## 2019-08-23 VITALS — BP 119/67 | HR 63 | Ht 63.0 in | Wt 155.2 lb

## 2019-08-23 DIAGNOSIS — Z1231 Encounter for screening mammogram for malignant neoplasm of breast: Secondary | ICD-10-CM | POA: Diagnosis not present

## 2019-08-23 DIAGNOSIS — Z01419 Encounter for gynecological examination (general) (routine) without abnormal findings: Secondary | ICD-10-CM | POA: Diagnosis not present

## 2019-08-23 MED ORDER — ZOLPIDEM TARTRATE 10 MG PO TABS: ORAL_TABLET | ORAL | 3 refills | Status: DC

## 2019-08-23 MED ORDER — ESTRADIOL 0.5 MG PO TABS: 0.5000 mg | ORAL_TABLET | Freq: Every day | ORAL | 6 refills | Status: DC

## 2019-08-23 MED ORDER — ZOLPIDEM TARTRATE 10 MG PO TABS: 10.0000 mg | ORAL_TABLET | Freq: Every evening | ORAL | 0 refills | Status: DC | PRN

## 2019-08-23 NOTE — Addendum Note (Signed)
Addended by: Hildred Priest on: 08/23/2019 08:52 AM   Modules accepted: Orders

## 2019-08-23 NOTE — Progress Notes (Addendum)
GYNECOLOGY ANNUAL PREVENTATIVE CARE ENCOUNTER NOTE  History:     Danielle Greene is a 48 y.o. G60P1001 female here for a routine annual gynecologic exam.  Current complaints: 15 lb wt gain with covid, not able to go to gym. Denies abnormal vaginal bleeding, discharge, pelvic pain, problems with intercourse or other gynecologic concerns.    Discussed working out at home and modification of diet. She verbalizes and agrees to plan of care. She states she has done weight watcher in the past and was successful.   Social History: Sexual: heterosexual Marital Status: married Living situation: with family Occupation: [employer] Tobacco/alcohol: no tobacco use Illicit drugs: no history of illicit drug use Exercise: irregular used to go to gym   Gynecologic History Patient's last menstrual period was 10/24/2013. Contraception: status post hysterectomy  Last Pap: 08/14/2016. Results were: normal with negative HPV Last mammogram: 07/25/19. Results were: normal  Obstetric History OB History  Gravida Para Term Preterm AB Living  1 1 1     1   SAB TAB Ectopic Multiple Live Births          1    # Outcome Date GA Lbr Len/2nd Weight Sex Delivery Anes PTL Lv  1 Term 2000    M Vag-Spont   LIV    Past Medical History:  Diagnosis Date  . Insomnia     Past Surgical History:  Procedure Laterality Date  . ABDOMINAL HYSTERECTOMY    . BREAST EXCISIONAL BIOPSY Right 2010   neg  . LEG SURGERY Right    COMPARTMENT SYNDROME  . PUBOVAGINAL SLING N/A 08/09/2017   Procedure: URETHRAL SLING-TOT;  Surgeon: Harlin Heys, MD;  Location: [facility] ORS;  Service: Gynecology;  Laterality: N/A;    Current Outpatient Medications on File Prior to Visit  Medication Sig Dispense Refill  . cetirizine (ZYRTEC) 10 MG tablet Take 10 mg by mouth daily.    Marland Kitchen estradiol (ESTRACE) 0.5 MG tablet Take 1 tablet (0.5 mg total) by mouth daily. 30 tablet 6  . hydrOXYzine (ATARAX/VISTARIL) 25 MG tablet Take 25 mg by mouth 3  (three) times daily as needed.    . zolpidem (AMBIEN) 10 MG tablet TAKE ONE TABLET AT BED- TIME IF NEEDED FOR SLEEP. 30 tablet 0  . zolpidem (AMBIEN) 10 MG tablet TAKE ONE TABLET AT BEDTIME AS NEEDED FOR SLEEP. 30 tablet 3   No current facility-administered medications on file prior to visit.    Allergies  Allergen Reactions  . Adhesive [Tape] Other (See Comments)    Turns red, skin raises up    Social History:  reports that she has never smoked. She has never used smokeless tobacco. She reports current alcohol use. She reports that she does not use drugs.  Family History  Problem Relation Age of Onset  . Dementia Mother   . Breast cancer Neg Hx   . Ovarian cancer Neg Hx     The following portions of the patient's history were reviewed and updated as appropriate: allergies, current medications, past family history, past medical history, past social history, past surgical history and problem list.  Review of Systems Pertinent items noted in HPI and remainder of comprehensive ROS otherwise negative.  Physical Exam:  LMP 10/24/2013 Comment: Partial hysterectomy  CONSTITUTIONAL: Well-developed, well-nourished female in no acute distress.  HENT:  Normocephalic, atraumatic, External right and left ear normal. Oropharynx is clear and moist EYES: Conjunctivae and EOM are normal. Pupils are equal, round, and reactive to light. No scleral icterus.  NECK:  Normal range of motion, supple, no masses.  Normal thyroid.  SKIN: Skin is warm and dry. No rash noted. Not diaphoretic. No erythema. No pallor. MUSCULOSKELETAL: Normal range of motion. No tenderness.  No cyanosis, clubbing, or edema.  2+ distal pulses. NEUROLOGIC: Alert and oriented to person, place, and time. Normal reflexes, muscle tone coordination.  PSYCHIATRIC: Normal mood and affect. Normal behavior. Normal judgment and thought content. CARDIOVASCULAR: Normal heart rate noted, regular rhythm RESPIRATORY: Clear to auscultation  bilaterally. Effort and breath sounds normal, no problems with respiration noted. BREASTS: Symmetric in size. No masses, tenderness, skin changes, nipple drainage, or lymphadenopathy bilaterally. ABDOMEN: Soft, no distention noted.  No tenderness, rebound or guarding.  PELVIC: Normal appearing external genitalia and urethral meatus; normal appearing vaginal mucosa and cervix.  No abnormal discharge noted.  Pap smear not indicated. Uterus absent, no other palpable masses,  or adnexal tenderness.   Assessment and Plan:    Annual Well Women GYN Exam . Pap smear not indicated Mammogram done 07/2019 Labs: none due Refills: Ambien, Estrace Information sheet (menoNote) given on menopause, self help measures reviewed. Discussed continued use of hormones to help with side effects. She verbalizes understanding.  Routine preventative health maintenance measures emphasized. Please refer to After Visit Summary for other counseling recommendations.     Doreene Burke, CNM

## 2019-08-23 NOTE — Patient Instructions (Signed)

## 2019-08-30 DIAGNOSIS — I8312 Varicose veins of left lower extremity with inflammation: Secondary | ICD-10-CM | POA: Diagnosis not present

## 2019-09-27 DIAGNOSIS — Z20828 Contact with and (suspected) exposure to other viral communicable diseases: Secondary | ICD-10-CM | POA: Diagnosis not present

## 2019-10-26 DIAGNOSIS — M9902 Segmental and somatic dysfunction of thoracic region: Secondary | ICD-10-CM | POA: Diagnosis not present

## 2019-10-26 DIAGNOSIS — M9903 Segmental and somatic dysfunction of lumbar region: Secondary | ICD-10-CM | POA: Diagnosis not present

## 2019-11-02 DIAGNOSIS — M9902 Segmental and somatic dysfunction of thoracic region: Secondary | ICD-10-CM | POA: Diagnosis not present

## 2019-11-02 DIAGNOSIS — M9903 Segmental and somatic dysfunction of lumbar region: Secondary | ICD-10-CM | POA: Diagnosis not present

## 2019-11-07 DIAGNOSIS — M9902 Segmental and somatic dysfunction of thoracic region: Secondary | ICD-10-CM | POA: Diagnosis not present

## 2019-11-07 DIAGNOSIS — M9903 Segmental and somatic dysfunction of lumbar region: Secondary | ICD-10-CM | POA: Diagnosis not present

## 2019-11-28 DIAGNOSIS — M9903 Segmental and somatic dysfunction of lumbar region: Secondary | ICD-10-CM | POA: Diagnosis not present

## 2019-11-28 DIAGNOSIS — M9902 Segmental and somatic dysfunction of thoracic region: Secondary | ICD-10-CM | POA: Diagnosis not present

## 2019-11-28 DIAGNOSIS — M9904 Segmental and somatic dysfunction of sacral region: Secondary | ICD-10-CM | POA: Diagnosis not present

## 2019-12-09 ENCOUNTER — Ambulatory Visit: Payer: Self-pay | Attending: Oncology

## 2019-12-09 DIAGNOSIS — Z23 Encounter for immunization: Secondary | ICD-10-CM

## 2019-12-09 NOTE — Progress Notes (Signed)
   Covid-19 Vaccination Clinic  Name:  Danielle Greene    MRN: [id number] DOB: 1970/09/24  12/09/2019  [name] was observed post Covid-19 immunization for 15 minutes without incident. She was provided with Vaccine Information Sheet and instruction to access the V-Safe system.   [name] was instructed to call 911 with any severe reactions post vaccine: Marland Kitchen Difficulty breathing  . Swelling of face and throat  . A fast heartbeat  . A bad rash all over body  . Dizziness and weakness   Immunizations Administered    Name Date Dose VIS Date Route   Pfizer COVID-19 Vaccine 12/09/2019  8:14 AM 0.3 mL 08/11/2019 Intramuscular   Manufacturer: [employer], [employer]   Lot: [id number]   NDC: [address]-6

## 2019-12-20 DIAGNOSIS — F4329 Adjustment disorder with other symptoms: Secondary | ICD-10-CM | POA: Diagnosis not present

## 2019-12-21 DIAGNOSIS — M9902 Segmental and somatic dysfunction of thoracic region: Secondary | ICD-10-CM | POA: Diagnosis not present

## 2019-12-21 DIAGNOSIS — M9903 Segmental and somatic dysfunction of lumbar region: Secondary | ICD-10-CM | POA: Diagnosis not present

## 2019-12-21 DIAGNOSIS — M9904 Segmental and somatic dysfunction of sacral region: Secondary | ICD-10-CM | POA: Diagnosis not present

## 2019-12-21 DIAGNOSIS — M5442 Lumbago with sciatica, left side: Secondary | ICD-10-CM | POA: Diagnosis not present

## 2019-12-26 DIAGNOSIS — M9903 Segmental and somatic dysfunction of lumbar region: Secondary | ICD-10-CM | POA: Diagnosis not present

## 2019-12-26 DIAGNOSIS — M9904 Segmental and somatic dysfunction of sacral region: Secondary | ICD-10-CM | POA: Diagnosis not present

## 2019-12-26 DIAGNOSIS — M9902 Segmental and somatic dysfunction of thoracic region: Secondary | ICD-10-CM | POA: Diagnosis not present

## 2020-01-02 ENCOUNTER — Ambulatory Visit: Payer: Self-pay | Attending: Internal Medicine

## 2020-01-02 DIAGNOSIS — Z23 Encounter for immunization: Secondary | ICD-10-CM

## 2020-01-02 NOTE — Progress Notes (Signed)
   Covid-19 Vaccination Clinic  Name:  Danielle Greene    MRN: [id number] DOB: 05-Dec-1970  01/02/2020  [name] was observed post Covid-19 immunization for 15 minutes without incident. She was provided with Vaccine Information Sheet and instruction to access the V-Safe system.   [name] was instructed to call 911 with any severe reactions post vaccine: Marland Kitchen Difficulty breathing  . Swelling of face and throat  . A fast heartbeat  . A bad rash all over body  . Dizziness and weakness   Immunizations Administered    Name Date Dose VIS Date Route   Pfizer COVID-19 Vaccine 01/02/2020  4:20 PM 0.3 mL 10/25/2018 Intramuscular   Manufacturer: [employer], [employer]   Lot: [id number]   NDC: [address]-0

## 2020-01-04 DIAGNOSIS — M9904 Segmental and somatic dysfunction of sacral region: Secondary | ICD-10-CM | POA: Diagnosis not present

## 2020-01-04 DIAGNOSIS — M9902 Segmental and somatic dysfunction of thoracic region: Secondary | ICD-10-CM | POA: Diagnosis not present

## 2020-01-04 DIAGNOSIS — M9903 Segmental and somatic dysfunction of lumbar region: Secondary | ICD-10-CM | POA: Diagnosis not present

## 2020-01-08 DIAGNOSIS — F411 Generalized anxiety disorder: Secondary | ICD-10-CM | POA: Diagnosis not present

## 2020-01-09 ENCOUNTER — Other Ambulatory Visit: Payer: Self-pay

## 2020-01-09 ENCOUNTER — Encounter: Payer: Self-pay | Admitting: Certified Nurse Midwife

## 2020-01-09 ENCOUNTER — Ambulatory Visit (INDEPENDENT_AMBULATORY_CARE_PROVIDER_SITE_OTHER): Payer: BC Managed Care – PPO | Admitting: Certified Nurse Midwife

## 2020-01-09 VITALS — BP 108/69 | HR 57 | Ht 63.0 in | Wt 149.0 lb

## 2020-01-09 DIAGNOSIS — F329 Major depressive disorder, single episode, unspecified: Secondary | ICD-10-CM

## 2020-01-09 DIAGNOSIS — F32A Depression, unspecified: Secondary | ICD-10-CM | POA: Insufficient documentation

## 2020-01-09 MED ORDER — SERTRALINE HCL 25 MG PO TABS
25.0000 mg | ORAL_TABLET | Freq: Every day | ORAL | 1 refills | Status: DC
Start: 2020-01-09 — End: 2020-02-07

## 2020-01-09 NOTE — Patient Instructions (Signed)
Complicated Grief Grief is a normal response to the death of someone close to you. Feelings of fear, anger, and guilt can affect almost everyone who loses a loved one. It is also common to have symptoms of depression while you are grieving. These include problems with sleep, loss of appetite, and lack of energy. They may last for weeks or months after a loss. Complicated grief is different from normal grief or depression. Normal grieving involves sadness and feelings of loss, but those feelings get better and heal over time. Complicated grief is a severe type of grief that lasts for a long time, usually for several months to a year or longer. It interferes with your ability to function normally. Complicated grief may require treatment from a mental health care provider. What are the causes? The cause of this condition is not known. It is not clear why some people continue to struggle with grief and others do not. What increases the risk? You are more likely to develop this condition if:  The death of your loved one was sudden or unexpected.  The death of your loved one was due to a violent event.  Your loved one died from suicide.  Your loved one was a child or a young person.  You were very close to your loved one, or you were dependent on him or her.  You have a history of depression or anxiety. What are the signs or symptoms? Symptoms of this condition include:  Feeling disbelief or having a lack of emotion (numbness).  Being unable to enjoy good memories of your loved one.  Needing to avoid anything or anyone that reminds you of your loved one.  Being unable to stop thinking about the death.  Feeling intense anger or guilt.  Feeling alone and hopeless.  Feeling that your life is meaningless and empty.  Losing the desire to move on with your life. How is this diagnosed? This condition may be diagnosed based on:  Your symptoms. Complicated grief will be diagnosed if you have  ongoing symptoms of grief for 6-12 months or longer.  The effect of symptoms on your life. You may be diagnosed with this condition if your symptoms are interfering with your ability to live your life. Your health care provider may recommend that you see a mental health care provider. Many symptoms of depression are similar to the symptoms of complicated grief. It is important to be evaluated for complicated grief along with other mental health conditions. How is this treated? This condition is most commonly treated with talk therapy. This therapy is offered by a mental health specialist (psychiatrist). During therapy:  You will learn healthy ways to cope with the loss of your loved one.  Your mental health care provider may recommend antidepressant medicines. Follow these instructions at home: Lifestyle   Take care of yourself. ? Eat on a regular basis, and maintain a healthy diet. Eat plenty of fruits, vegetables, lean protein, and whole grains. ? Try to get some exercise each day. Aim for 30 minutes of exercise on most days of the week. ? Keep a consistent sleep schedule. Try to get 8 or more hours of sleep each night. ? Start doing the things that you used to enjoy.  Do not use drugs or alcohol to ease your symptoms.  Spend time with friends and loved ones. General instructions  Take over-the-counter and prescription medicines only as told by your health care provider.  Consider joining a grief (bereavement) support group   to help you deal with your loss.  Keep all follow-up visits as told by your health care provider. This is important. Contact a health care provider if:  Your symptoms prevent you from functioning normally.  Your symptoms do not get better with treatment. Get help right away if:  You have serious thoughts about hurting yourself or someone else.  You have suicidal feelings. If you ever feel like you may hurt yourself or others, or have thoughts about taking  your own life, get help right away. You can go to your nearest emergency department or call:  Your local emergency services (911 in the [place]  A suicide crisis helpline, such as the National Suicide Prevention Lifeline at 1-800-273-8255. This is open 24 hours a day. Summary  Complicated grief is a severe type of grief that lasts for a long time. This grief is not likely to go away on its own. Get the help you need.  Some griefs are more difficult than others and can cause this condition. You may need a certain type of treatment to help you recover if the loss of your loved one was sudden, violent, or due to suicide.  You may feel guilty about moving on with your life. Getting help does not mean that you are forgetting your loved one. It means that you are taking care of yourself.  Complicated grief is best treated with talk therapy. Medicines may also be prescribed.  Seek the help you need, and find support that will help you recover. This information is not intended to replace advice given to you by your health care provider. Make sure you discuss any questions you have with your health care provider. Document Revised: 07/30/2017 Document Reviewed: 06/02/2017 Elsevier Patient Education  2020 Elsevier Inc.  

## 2020-01-09 NOTE — Progress Notes (Signed)
History of Present Illness   Patient Identification Danielle Greene is a 49 y.o. female.  Patient information was obtained from patient. History/Exam limitations: none. Patient presented voluntarily to the office   Chief Complaint  Grief due to loss of her mother in October. She is also having difficultly dealing with new relationship that her father has. She states that the person her father is talking to was her mothers care taker.   Patient presents with depression. Onset of symptoms was gradual starting 6 months ago. Symptoms are of mild severity and are unchanged. Patient states symptoms have been exacerbated by relationship problem with her father and within her family as they are still grieving the loss of her mother.  Symptoms are associated with difficult focusing and motivating herself to do daily activities. Patient has no intent to harm herself. Care prior to arrival consisted of counseling. She has another appointment next week. She states that the counseling is helping her process her feelings.   Past Medical History:  Diagnosis Date  . Insomnia    Family History  Problem Relation Age of Onset  . Dementia Mother   . Breast cancer Neg Hx   . Ovarian cancer Neg Hx    Scheduled Meds: Continuous Infusions: PRN Meds:  Allergies  Allergen Reactions  . Adhesive [Tape] Other (See Comments)    Turns red, skin raises up   Social History   Socioeconomic History  . Marital status: Married    Spouse name: Not on file  . Number of children: Not on file  . Years of education: Not on file  . Highest education level: Not on file  Occupational History  . Not on file  Tobacco Use  . Smoking status: Never Smoker  . Smokeless tobacco: Never Used  Substance and Sexual Activity  . Alcohol use: Yes    Comment: OCC  . Drug use: No  . Sexual activity: Yes    Birth control/protection: Surgical  Other Topics Concern  . Not on file  Social History Narrative  . Not on  file   Social Determinants of Health   Financial Resource Strain:   . Difficulty of Paying Living Expenses:   Food Insecurity:   . Worried About Charity fundraiser in the Last Year:   . Arboriculturist in the Last Year:   Transportation Needs:   . Film/video editor (Medical):   Marland Kitchen Lack of Transportation (Non-Medical):   Physical Activity:   . Days of Exercise per Week:   . Minutes of Exercise per Session:   Stress:   . Feeling of Stress :   Social Connections:   . Frequency of Communication with Friends and Family:   . Frequency of Social Gatherings with Friends and Family:   . Attends Religious Services:   . Active Member of Clubs or Organizations:   . Attends Archivist Meetings:   Marland Kitchen Marital Status:   Intimate Partner Violence:   . Fear of Current or Ex-Partner:   . Emotionally Abused:   Marland Kitchen Physically Abused:   . Sexually Abused:    Review of Systems  Constitutional: negative Eyes: negative Ears, nose, mouth, throat, and face: negative Respiratory: negative Cardiovascular: negative Gastrointestinal: negative Genitourinary:negative Integument/breast: negative Hematologic/lymphatic: negative Musculoskeletal:negative Neurological: negative Behavioral/Psych: positive for decreased appetite, depression, fatigue and loss of interest in favorite activities Endocrine: negative Allergic/Immunologic: negative   PHQ9 SCORE ONLY 01/09/2020  Score 1    Physical Exam   BP 108/69  Pulse (!) 57   Ht 5\' 3"  (1.6 m)   Wt 149 lb (67.6 kg)   LMP 10/24/2013 Comment: Partial hysterectomy   BMI 26.39 kg/m  No exam performed today, not indicated.     Studies: None indicated.  Treatments: Counseling  with 20 minutes spent with patient  listening to her concerns , reviewing history, discussing treatment with SSRI, answering questions. Zoloft 25 mg daily, follow up 4 wks for medication check.   10/26/2013, CNM

## 2020-01-31 DIAGNOSIS — F411 Generalized anxiety disorder: Secondary | ICD-10-CM | POA: Diagnosis not present

## 2020-02-06 ENCOUNTER — Telehealth: Payer: Self-pay | Admitting: Certified Nurse Midwife

## 2020-02-06 NOTE — Telephone Encounter (Signed)
Pt called in and stated that she is out of her Ambien. The pt stated that the pharmacy faxed over a refill request. I informed the pt we are sorry for this inconvenience. The pt then stated she has appt tomorrow 6/9, she will talk to  Fults at the appt.

## 2020-02-07 ENCOUNTER — Other Ambulatory Visit: Payer: Self-pay

## 2020-02-07 ENCOUNTER — Ambulatory Visit (INDEPENDENT_AMBULATORY_CARE_PROVIDER_SITE_OTHER): Payer: BC Managed Care – PPO | Admitting: Certified Nurse Midwife

## 2020-02-07 ENCOUNTER — Encounter: Payer: Self-pay | Admitting: Certified Nurse Midwife

## 2020-02-07 VITALS — BP 135/72 | HR 50 | Ht 63.0 in | Wt 150.0 lb

## 2020-02-07 DIAGNOSIS — F32A Depression, unspecified: Secondary | ICD-10-CM

## 2020-02-07 DIAGNOSIS — F329 Major depressive disorder, single episode, unspecified: Secondary | ICD-10-CM

## 2020-02-07 MED ORDER — ZOLPIDEM TARTRATE 10 MG PO TABS: 10.0000 mg | ORAL_TABLET | Freq: Every evening | ORAL | 3 refills | Status: DC | PRN

## 2020-02-07 MED ORDER — SERTRALINE HCL 25 MG PO TABS: 25.0000 mg | ORAL_TABLET | Freq: Every day | ORAL | 5 refills | Status: DC

## 2020-02-07 NOTE — Telephone Encounter (Signed)
Refill on Ambien faxed to pharmacy due to thumb print machine not working.

## 2020-02-07 NOTE — Progress Notes (Signed)
  Medication Management Clinic Visit Note  Patient: Danielle Greene MRN: [id number] Date of Birth: [birth year] PCP: Purcell Nails, CNM   Chelly L Alcoser 49 y.o. female presents for a follow up visit today.She started on zoloft for depression related to grief from the loss of her mother in October. She states that things are overall getting better. She continues to see a Licensed conveyancer.   BP 135/72   Pulse (!) 50   Ht 5\' 3"  (1.6 m)   Wt 150 lb (68 kg)   LMP 10/24/2013 Comment: Partial hysterectomy   BMI 26.57 kg/m   Patient Information   Past Medical History:  Diagnosis Date  . Insomnia       Past Surgical History:  Procedure Laterality Date  . ABDOMINAL HYSTERECTOMY    . BREAST EXCISIONAL BIOPSY Right 2010   neg  . LEG SURGERY Right    COMPARTMENT SYNDROME  . PUBOVAGINAL SLING N/A 08/09/2017   Procedure: URETHRAL SLING-TOT;  Surgeon: 14/05/2017, MD;  Location: [facility] ORS;  Service: Gynecology;  Laterality: N/A;     Family History  Problem Relation Age of Onset  . Dementia Mother   . Breast cancer Neg Hx   . Ovarian cancer Neg Hx      Social History   Substance and Sexual Activity  Alcohol Use Yes   Comment: OCC      Social History   Tobacco Use  Smoking Status Never Smoker  Smokeless Tobacco Never Used      Health Maintenance  Topic Date Due  . PAP SMEAR-Modifier  08/15/2019  . Hepatitis C Screening  02/06/2021 (Originally 1970-09-30)  . HIV Screening  02/06/2021 (Originally 12/16/1986)  . TETANUS/TDAP  08/14/2026  . COVID-19 Vaccine  Completed      Office Visit from 02/07/2020 in [facility] Care  PHQ-9 Total Score  0     GAD 7 : Generalized Anxiety Score 02/07/2020 01/09/2020  Nervous, Anxious, on Edge 0 1  Control/stop worrying 0 1  Worry too much - different things 0 1  Trouble relaxing 0 1  Restless 0 0  Easily annoyed or irritable 0 1  Afraid - awful might happen 0 0  Total GAD 7 Score 0 5     Assessment and  Plan:  She is doing well. And would like to continue with the use of Zoloft. She is eating regularly and exercising to help manage derepression.She states she is motivating better.  She is taking Ambien to help with rest. She request refill for Ambien. Orders placed. Follow up as scheduled for annual exam.  Counseling with 10 min reviewing history, discussing symptoms, and answering her questions.   03/10/2020, CNM

## 2020-02-07 NOTE — Patient Instructions (Signed)
Complicated Grief Grief is a normal response to the death of someone close to you. Feelings of fear, anger, and guilt can affect almost everyone who loses a loved one. It is also common to have symptoms of depression while you are grieving. These include problems with sleep, loss of appetite, and lack of energy. They may last for weeks or months after a loss. Complicated grief is different from normal grief or depression. Normal grieving involves sadness and feelings of loss, but those feelings get better and heal over time. Complicated grief is a severe type of grief that lasts for a long time, usually for several months to a year or longer. It interferes with your ability to function normally. Complicated grief may require treatment from a mental health care provider. What are the causes? The cause of this condition is not known. It is not clear why some people continue to struggle with grief and others do not. What increases the risk? You are more likely to develop this condition if:  The death of your loved one was sudden or unexpected.  The death of your loved one was due to a violent event.  Your loved one died from suicide.  Your loved one was a child or a young person.  You were very close to your loved one, or you were dependent on him or her.  You have a history of depression or anxiety. What are the signs or symptoms? Symptoms of this condition include:  Feeling disbelief or having a lack of emotion (numbness).  Being unable to enjoy good memories of your loved one.  Needing to avoid anything or anyone that reminds you of your loved one.  Being unable to stop thinking about the death.  Feeling intense anger or guilt.  Feeling alone and hopeless.  Feeling that your life is meaningless and empty.  Losing the desire to move on with your life. How is this diagnosed? This condition may be diagnosed based on:  Your symptoms. Complicated grief will be diagnosed if you have  ongoing symptoms of grief for 6-12 months or longer.  The effect of symptoms on your life. You may be diagnosed with this condition if your symptoms are interfering with your ability to live your life. Your health care provider may recommend that you see a mental health care provider. Many symptoms of depression are similar to the symptoms of complicated grief. It is important to be evaluated for complicated grief along with other mental health conditions. How is this treated? This condition is most commonly treated with talk therapy. This therapy is offered by a mental health specialist (psychiatrist). During therapy:  You will learn healthy ways to cope with the loss of your loved one.  Your mental health care provider may recommend antidepressant medicines. Follow these instructions at home: Lifestyle   Take care of yourself. ? Eat on a regular basis, and maintain a healthy diet. Eat plenty of fruits, vegetables, lean protein, and whole grains. ? Try to get some exercise each day. Aim for 30 minutes of exercise on most days of the week. ? Keep a consistent sleep schedule. Try to get 8 or more hours of sleep each night. ? Start doing the things that you used to enjoy.  Do not use drugs or alcohol to ease your symptoms.  Spend time with friends and loved ones. General instructions  Take over-the-counter and prescription medicines only as told by your health care provider.  Consider joining a grief (bereavement) support group   to help you deal with your loss.  Keep all follow-up visits as told by your health care provider. This is important. Contact a health care provider if:  Your symptoms prevent you from functioning normally.  Your symptoms do not get better with treatment. Get help right away if:  You have serious thoughts about hurting yourself or someone else.  You have suicidal feelings. If you ever feel like you may hurt yourself or others, or have thoughts about taking  your own life, get help right away. You can go to your nearest emergency department or call:  Your local emergency services (911 in the [place]  A suicide crisis helpline, such as the National Suicide Prevention Lifeline at 1-800-273-8255. This is open 24 hours a day. Summary  Complicated grief is a severe type of grief that lasts for a long time. This grief is not likely to go away on its own. Get the help you need.  Some griefs are more difficult than others and can cause this condition. You may need a certain type of treatment to help you recover if the loss of your loved one was sudden, violent, or due to suicide.  You may feel guilty about moving on with your life. Getting help does not mean that you are forgetting your loved one. It means that you are taking care of yourself.  Complicated grief is best treated with talk therapy. Medicines may also be prescribed.  Seek the help you need, and find support that will help you recover. This information is not intended to replace advice given to you by your health care provider. Make sure you discuss any questions you have with your health care provider. Document Revised: 07/30/2017 Document Reviewed: 06/02/2017 Elsevier Patient Education  2020 Elsevier Inc.  

## 2020-02-12 DIAGNOSIS — F411 Generalized anxiety disorder: Secondary | ICD-10-CM | POA: Diagnosis not present

## 2020-03-04 DIAGNOSIS — L578 Other skin changes due to chronic exposure to nonionizing radiation: Secondary | ICD-10-CM | POA: Diagnosis not present

## 2020-03-04 DIAGNOSIS — Z86018 Personal history of other benign neoplasm: Secondary | ICD-10-CM | POA: Diagnosis not present

## 2020-03-04 DIAGNOSIS — L308 Other specified dermatitis: Secondary | ICD-10-CM | POA: Diagnosis not present

## 2020-03-22 DIAGNOSIS — M9902 Segmental and somatic dysfunction of thoracic region: Secondary | ICD-10-CM | POA: Diagnosis not present

## 2020-03-22 DIAGNOSIS — M9903 Segmental and somatic dysfunction of lumbar region: Secondary | ICD-10-CM | POA: Diagnosis not present

## 2020-03-22 DIAGNOSIS — M9904 Segmental and somatic dysfunction of sacral region: Secondary | ICD-10-CM | POA: Diagnosis not present

## 2020-04-05 ENCOUNTER — Other Ambulatory Visit: Payer: Self-pay | Admitting: Certified Nurse Midwife

## 2020-04-05 NOTE — Telephone Encounter (Signed)
Estrace refill sent per request

## 2020-08-01 ENCOUNTER — Ambulatory Visit
Admission: RE | Admit: 2020-08-01 | Discharge: 2020-08-01 | Disposition: A | Discharge status: Home / Self Care | Payer: BC Managed Care – PPO | Source: Ambulatory Visit | Attending: Certified Nurse Midwife | Admitting: Certified Nurse Midwife

## 2020-08-01 ENCOUNTER — Other Ambulatory Visit: Payer: Self-pay

## 2020-08-01 DIAGNOSIS — Z01419 Encounter for gynecological examination (general) (routine) without abnormal findings: Secondary | ICD-10-CM | POA: Diagnosis not present

## 2020-08-01 DIAGNOSIS — Z1231 Encounter for screening mammogram for malignant neoplasm of breast: Secondary | ICD-10-CM

## 2020-08-05 ENCOUNTER — Other Ambulatory Visit: Payer: Self-pay | Admitting: Certified Nurse Midwife

## 2020-08-05 ENCOUNTER — Other Ambulatory Visit: Payer: Self-pay

## 2020-08-05 DIAGNOSIS — N6489 Other specified disorders of breast: Secondary | ICD-10-CM

## 2020-08-05 DIAGNOSIS — R928 Other abnormal and inconclusive findings on diagnostic imaging of breast: Secondary | ICD-10-CM

## 2020-08-05 NOTE — Progress Notes (Signed)
I do not need the diagnosis code I need the order code. The only thing that pops up for me is regular mammogram. I cant find a diagnostic with possible u/s. I wish they would just send me a message with what to order!

## 2020-08-09 DIAGNOSIS — M25852 Other specified joint disorders, left hip: Secondary | ICD-10-CM | POA: Diagnosis not present

## 2020-08-09 DIAGNOSIS — M25552 Pain in left hip: Secondary | ICD-10-CM | POA: Diagnosis not present

## 2020-08-19 ENCOUNTER — Ambulatory Visit
Admission: RE | Admit: 2020-08-19 | Discharge: 2020-08-19 | Disposition: A | Discharge status: Home / Self Care | Payer: BC Managed Care – PPO | Source: Ambulatory Visit | Attending: Certified Nurse Midwife | Admitting: Certified Nurse Midwife

## 2020-08-19 ENCOUNTER — Other Ambulatory Visit: Payer: Self-pay

## 2020-08-19 DIAGNOSIS — R928 Other abnormal and inconclusive findings on diagnostic imaging of breast: Secondary | ICD-10-CM | POA: Insufficient documentation

## 2020-08-19 DIAGNOSIS — N6489 Other specified disorders of breast: Secondary | ICD-10-CM

## 2020-08-22 NOTE — Patient Instructions (Signed)
Preventive Care 40-48 Years Old, Female Preventive care refers to visits with your health care provider and lifestyle choices that can promote health and wellness. This includes:  A yearly physical exam. This may also be called an annual well check.  Regular dental visits and eye exams.  Immunizations.  Screening for certain conditions.  Healthy lifestyle choices, such as eating a healthy diet, getting regular exercise, not using drugs or products that contain nicotine and tobacco, and limiting alcohol use. What can I expect for my preventive care visit? Physical exam Your health care provider will check your:  Height and weight. This may be used to calculate body mass index (BMI), which tells if you are at a healthy weight.  Heart rate and blood pressure.  Skin for abnormal spots. Counseling Your health care provider may ask you questions about your:  Alcohol, tobacco, and drug use.  Emotional well-being.  Home and relationship well-being.  Sexual activity.  Eating habits.  Work and work environment.  Method of birth control.  Menstrual cycle.  Pregnancy history. What immunizations do I need?  Influenza (flu) vaccine  This is recommended every year. Tetanus, diphtheria, and pertussis (Tdap) vaccine  You may need a Td booster every 10 years. Varicella (chickenpox) vaccine  You may need this if you have not been vaccinated. Zoster (shingles) vaccine  You may need this after age 60. Measles, mumps, and rubella (MMR) vaccine  You may need at least one dose of MMR if you were born in 1957 or later. You may also need a second dose. Pneumococcal conjugate (PCV13) vaccine  You may need this if you have certain conditions and were not previously vaccinated. Pneumococcal polysaccharide (PPSV23) vaccine  You may need one or two doses if you smoke cigarettes or if you have certain conditions. Meningococcal conjugate (MenACWY) vaccine  You may need this if you  have certain conditions. Hepatitis A vaccine  You may need this if you have certain conditions or if you travel or work in places where you may be exposed to hepatitis A. Hepatitis B vaccine  You may need this if you have certain conditions or if you travel or work in places where you may be exposed to hepatitis B. Haemophilus influenzae type b (Hib) vaccine  You may need this if you have certain conditions. Human papillomavirus (HPV) vaccine  If recommended by your health care provider, you may need three doses over 6 months. You may receive vaccines as individual doses or as more than one vaccine together in one shot (combination vaccines). Talk with your health care provider about the risks and benefits of combination vaccines. What tests do I need? Blood tests  Lipid and cholesterol levels. These may be checked every 5 years, or more frequently if you are over 50 years old.  Hepatitis C test.  Hepatitis B test. Screening  Lung cancer screening. You may have this screening every year starting at age 55 if you have a 30-pack-year history of smoking and currently smoke or have quit within the past 15 years.  Colorectal cancer screening. All adults should have this screening starting at age 50 and continuing until age 75. Your health care provider may recommend screening at age 45 if you are at increased risk. You will have tests every 1-10 years, depending on your results and the type of screening test.  Diabetes screening. This is done by checking your blood sugar (glucose) after you have not eaten for a while (fasting). You may have this   done every 1-3 years.  Mammogram. This may be done every 1-2 years. Talk with your health care provider about when you should start having regular mammograms. This may depend on whether you have a family history of breast cancer.  BRCA-related cancer screening. This may be done if you have a family history of breast, ovarian, tubal, or peritoneal  cancers.  Pelvic exam and Pap test. This may be done every 3 years starting at age 60. Starting at age 7, this may be done every 5 years if you have a Pap test in combination with an HPV test. Other tests  Sexually transmitted disease (STD) testing.  Bone density scan. This is done to screen for osteoporosis. You may have this scan if you are at high risk for osteoporosis. Follow these instructions at home: Eating and drinking  Eat a diet that includes fresh fruits and vegetables, whole grains, lean protein, and low-fat dairy.  Take vitamin and mineral supplements as recommended by your health care provider.  Do not drink alcohol if: ? Your health care provider tells you not to drink. ? You are pregnant, may be pregnant, or are planning to become pregnant.  If you drink alcohol: ? Limit how much you have to 0-1 drink a day. ? Be aware of how much alcohol is in your drink. In the [place] one drink equals one 12 oz bottle of beer (355 mL), one 5 oz glass of wine (148 mL), or one 1 oz glass of hard liquor (44 mL). Lifestyle  Take daily care of your teeth and gums.  Stay active. Exercise for at least 30 minutes on 5 or more days each week.  Do not use any products that contain nicotine or tobacco, such as cigarettes, e-cigarettes, and chewing tobacco. If you need help quitting, ask your health care provider.  If you are sexually active, practice safe sex. Use a condom or other form of birth control (contraception) in order to prevent pregnancy and STIs (sexually transmitted infections).  If told by your health care provider, take low-dose aspirin daily starting at age 48. What's next?  Visit your health care provider once a year for a well check visit.  Ask your health care provider how often you should have your eyes and teeth checked.  Stay up to date on all vaccines. This information is not intended to replace advice given to you by your health care provider. Make sure you  discuss any questions you have with your health care provider. Document Revised: 04/28/2018 Document Reviewed: 04/28/2018 Elsevier Patient Education  2020 [place] Breast self-awareness is knowing how your breasts look and feel. Doing breast self-awareness is important. It allows you to catch a breast problem early while it is still small and can be treated. All women should do breast self-awareness, including women who have had breast implants. Tell your doctor if you notice a change in your breasts. What you need:  A mirror.  A well-lit room. How to do a breast self-exam A breast self-exam is one way to learn what is normal for your breasts and to check for changes. To do a breast self-exam: Look for changes  1. Take off all the clothes above your waist. 2. Stand in front of a mirror in a room with good lighting. 3. Put your hands on your hips. 4. Push your hands down. 5. Look at your breasts and nipples in the mirror to see if one breast or nipple looks different from the  other. Check to see if: ? The shape of one breast is different. ? The size of one breast is different. ? There are wrinkles, dips, and bumps in one breast and not the other. 6. Look at each breast for changes in the skin, such as: ? Redness. ? Scaly areas. 7. Look for changes in your nipples, such as: ? Liquid around the nipples. ? Bleeding. ? Dimpling. ? Redness. ? A change in where the nipples are. Feel for changes  1. Lie on your back on the floor. 2. Feel each breast. To do this, follow these steps: ? Pick a breast to feel. ? Put the arm closest to that breast above your head. ? Use your other arm to feel the nipple area of your breast. Feel the area with the pads of your three middle fingers by making small circles with your fingers. For the first circle, press lightly. For the second circle, press harder. For the third circle, press even harder. ? Keep making circles with  your fingers at the different pressures as you move down your breast. Stop when you feel your ribs. ? Move your fingers a little toward the center of your body. ? Start making circles with your fingers again, this time going up until you reach your collarbone. ? Keep making up-and-down circles until you reach your armpit. Remember to keep using the three pressures. ? Feel the other breast in the same way. 3. Sit or stand in the tub or shower. 4. With soapy water on your skin, feel each breast the same way you did in step 2 when you were lying on the floor. Write down what you find Writing down what you find can help you remember what to tell your doctor. Write down:  What is normal for each breast.  Any changes you find in each breast, including: ? The kind of changes you find. ? Whether you have pain. ? Size and location of any lumps.  When you last had your menstrual period. General tips  Check your breasts every month.  If you are breastfeeding, the best time to check your breasts is after you feed your baby or after you use a breast pump.  If you get menstrual periods, the best time to check your breasts is 5-7 days after your menstrual period is over.  With time, you will become comfortable with the self-exam, and you will begin to know if there are changes in your breasts. Contact a doctor if you:  See a change in the shape or size of your breasts or nipples.  See a change in the skin of your breast or nipples, such as red or scaly skin.  Have fluid coming from your nipples that is not normal.  Find a lump or thick area that was not there before.  Have pain in your breasts.  Have any concerns about your breast health. Summary  Breast self-awareness includes looking for changes in your breasts, as well as feeling for changes within your breasts.  Breast self-awareness should be done in front of a mirror in a well-lit room.  You should check your breasts every month.  If you get menstrual periods, the best time to check your breasts is 5-7 days after your menstrual period is over.  Let your doctor know of any changes you see in your breasts, including changes in size, changes on the skin, pain or tenderness, or fluid from your nipples that is not normal. This information is not  intended to replace advice given to you by your health care provider. Make sure you discuss any questions you have with your health care provider. Document Revised: 04/05/2018 Document Reviewed: 04/05/2018 Elsevier Patient Education  [place].

## 2020-08-22 NOTE — Progress Notes (Signed)
Annual Exam-Pt present for annual exam. Pt stated that she was doing well and requested refilled of the following medication Ambien and Estradiol.

## 2020-08-26 ENCOUNTER — Encounter: Payer: Self-pay | Admitting: Certified Nurse Midwife

## 2020-08-26 ENCOUNTER — Ambulatory Visit (INDEPENDENT_AMBULATORY_CARE_PROVIDER_SITE_OTHER): Payer: BC Managed Care – PPO | Admitting: Certified Nurse Midwife

## 2020-08-26 ENCOUNTER — Other Ambulatory Visit: Payer: Self-pay

## 2020-08-26 VITALS — BP 116/75 | HR 64 | Ht 63.0 in | Wt 160.5 lb

## 2020-08-26 DIAGNOSIS — Z01419 Encounter for gynecological examination (general) (routine) without abnormal findings: Secondary | ICD-10-CM | POA: Diagnosis not present

## 2020-08-26 DIAGNOSIS — Z124 Encounter for screening for malignant neoplasm of cervix: Secondary | ICD-10-CM

## 2020-08-26 MED ORDER — TRIAMCINOLONE ACETONIDE 0.1 % EX OINT: TOPICAL_OINTMENT | Freq: Two times a day (BID) | CUTANEOUS | 0 refills | Status: AC | PRN

## 2020-08-26 MED ORDER — ZOLPIDEM TARTRATE 10 MG PO TABS
10.0000 mg | ORAL_TABLET | Freq: Every evening | ORAL | 3 refills | Status: DC | PRN
Start: 2020-08-26 — End: 2021-08-27

## 2020-08-26 MED ORDER — ESTRADIOL 1 MG PO TABS: 1.0000 mg | ORAL_TABLET | Freq: Every day | ORAL | 11 refills | Status: DC

## 2020-08-26 NOTE — Progress Notes (Signed)
GYNECOLOGY ANNUAL PREVENTATIVE CARE ENCOUNTER NOTE  History:     Danielle Greene is a 49 y.o. G57P1001 female here for a routine annual gynecologic exam.  Current complaints: episodes of labial itching .   Denies abnormal vaginal bleeding, discharge, pelvic pain, problems with intercourse or other gynecologic concerns.     Social Relationship:married  Living: with spouse  Work: Exercise: Smoke/Alcohol/drug [id number]  Gynecologic History Patient's last menstrual period was 10/24/2013. Contraception: partial hystorectomy ( has ovaries) Last Pap: 08/14/2016. Results were: normal with negative HPV Last mammogram: 08/19/20 Results were: normal  Obstetric History OB History  Gravida Para Term Preterm AB Living  1 1 1     1   SAB IAB Ectopic Multiple Live Births          1    # Outcome Date GA Lbr Len/2nd Weight Sex Delivery Anes PTL Lv  1 Term 2000    M Vag-Spont   LIV    Past Medical History:  Diagnosis Date  . Insomnia     Past Surgical History:  Procedure Laterality Date  . ABDOMINAL HYSTERECTOMY    . BREAST EXCISIONAL BIOPSY Right 2010   neg  . LEG SURGERY Right    COMPARTMENT SYNDROME  . PUBOVAGINAL SLING N/A 08/09/2017   Procedure: URETHRAL SLING-TOT;  Surgeon: 14/05/2017, MD;  Location: [facility] ORS;  Service: Gynecology;  Laterality: N/A;    Current Outpatient Medications on File Prior to Visit  Medication Sig Dispense Refill  . cetirizine (ZYRTEC) 10 MG tablet Take 10 mg by mouth daily.    Linzie Collin estradiol (ESTRACE) 0.5 MG tablet Take 1 tablet (0.5 mg total) by mouth daily. 30 tablet 3  . hydrOXYzine (ATARAX/VISTARIL) 25 MG tablet Take 25 mg by mouth 3 (three) times daily as needed.    . sertraline (ZOLOFT) 25 MG tablet Take 1 tablet (25 mg total) by mouth daily. 30 tablet 5  . zolpidem (AMBIEN) 10 MG tablet Take 1 tablet (10 mg total) by mouth at bedtime as needed for sleep. 30 tablet 3   No current facility-administered medications on file prior to  visit.    Allergies  Allergen Reactions  . Adhesive [Tape] Other (See Comments)    Turns red, skin raises up    Social History:  reports that she has never smoked. She has never used smokeless tobacco. She reports current alcohol use. She reports that she does not use drugs.  Family History  Problem Relation Age of Onset  . Dementia Mother   . Breast cancer Neg Hx   . Ovarian cancer Neg Hx     The following portions of the patient's history were reviewed and updated as appropriate: allergies, current medications, past family history, past medical history, past social history, past surgical history and problem list.  Review of Systems Pertinent items noted in HPI and remainder of comprehensive ROS otherwise negative.  Physical Exam:  LMP 10/24/2013 Comment: Partial hysterectomy  CONSTITUTIONAL: Well-developed, well-nourished female in no acute distress.  HENT:  Normocephalic, atraumatic, External right and left ear normal. Oropharynx is clear and moist EYES: Conjunctivae and EOM are normal. Pupils are equal, round, and reactive to light. No scleral icterus.  NECK: Normal range of motion, supple, no masses.  Normal thyroid.  SKIN: Skin is warm and dry. No rash noted. Not diaphoretic. No erythema. No pallor. MUSCULOSKELETAL: Normal range of motion. No tenderness.  No cyanosis, clubbing, or edema.  2+ distal pulses. NEUROLOGIC: Alert and oriented to person, place, and  time. Normal reflexes, muscle tone coordination.  PSYCHIATRIC: Normal mood and affect. Normal behavior. Normal judgment and thought content. CARDIOVASCULAR: Normal heart rate noted, regular rhythm RESPIRATORY: Clear to auscultation bilaterally. Effort and breath sounds normal, no problems with respiration noted. BREASTS: Symmetric in size. No masses, tenderness, skin changes, nipple drainage, or lymphadenopathy bilaterally.  ABDOMEN: Soft, no distention noted.  No tenderness, rebound or guarding.  PELVIC: Normal  appearing external genitalia and urethral meatus; normal appearing vaginal mucosa and cervix.  No abnormal discharge noted.  Pap smear not indicated.uterine absent, no other palpable masses, no adnexal tenderness.  .   Assessment and Plan:    1. Encounter for well woman exam with routine gynecological exam  Pap:n/a Hysterotomy  Mammogram :done Labs: declines Refills: Ambien, estrace, kenalog  Referral: none Routine preventative health maintenance measures emphasized. Please refer to After Visit Summary for other counseling recommendations.      Doreene Burke, CNM [facility],  [facility]

## 2020-10-21 IMAGING — MG DIGITAL SCREENING BILAT W/ TOMO W/ CAD
8 series · 8 of 24 positions shown · non-contrast
Comparison: Previous exam(s).

CLINICAL DATA: Screening.

EXAM:
DIGITAL SCREENING BILATERAL MAMMOGRAM WITH TOMO AND CAD

[L CC synth-2D]
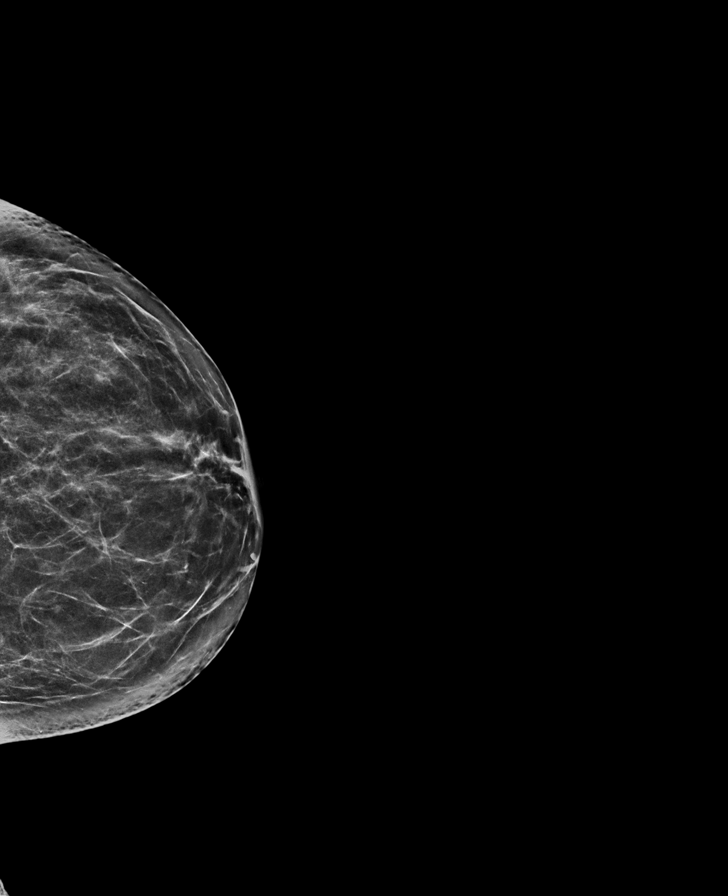

[R MLO synth-2D]
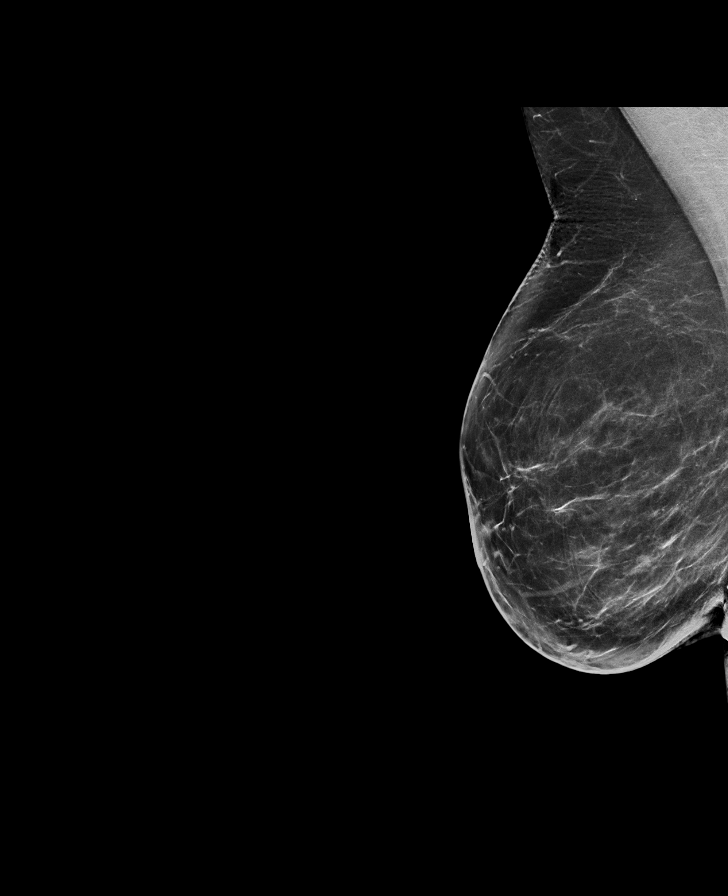

[R CC synth-2D]
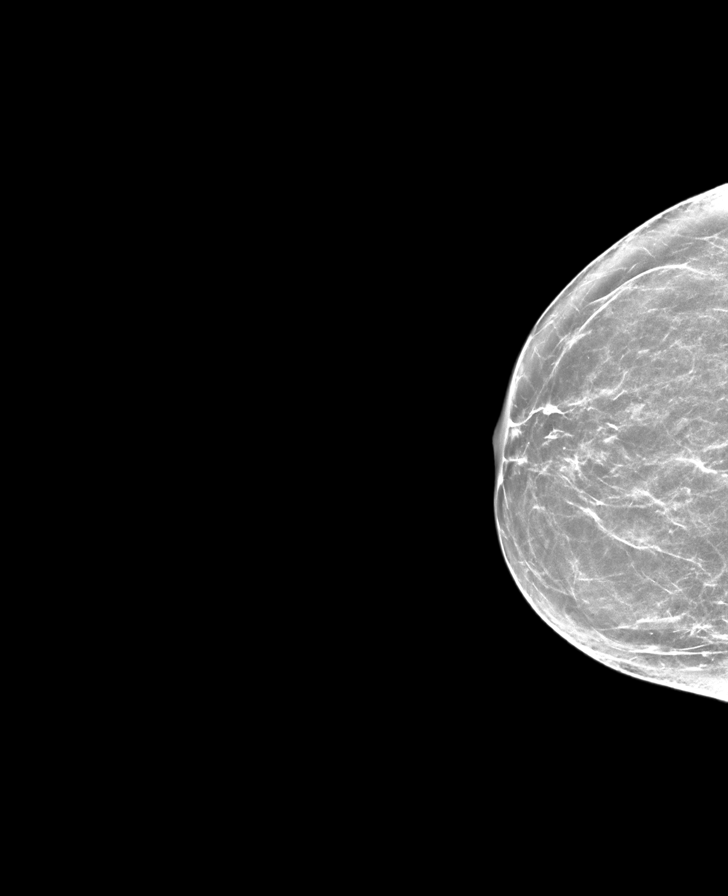

[L MLO synth-2D]
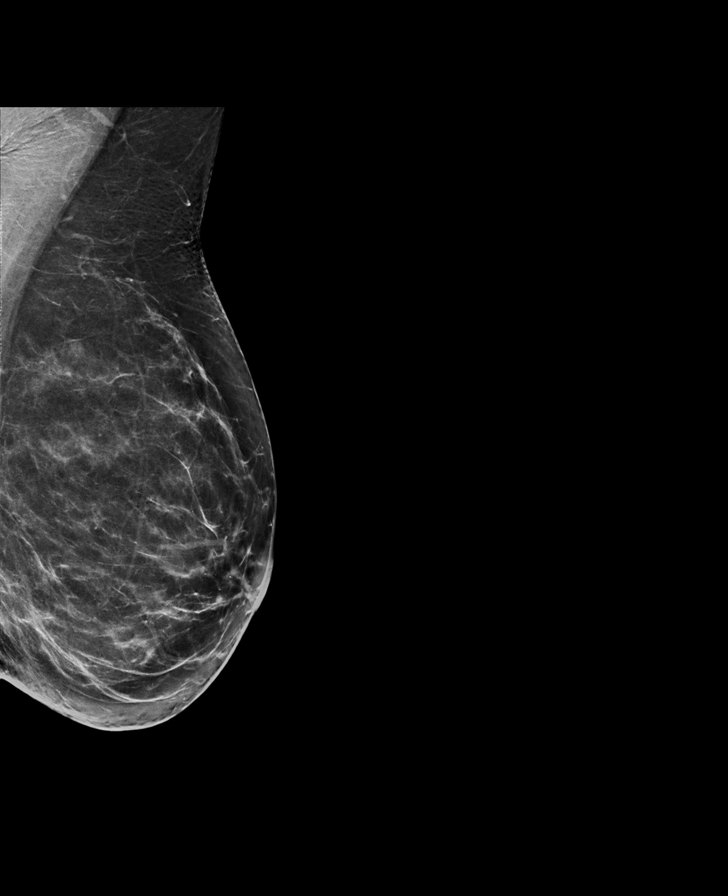

[L MLO tomo · tomo slice 39/78.0]
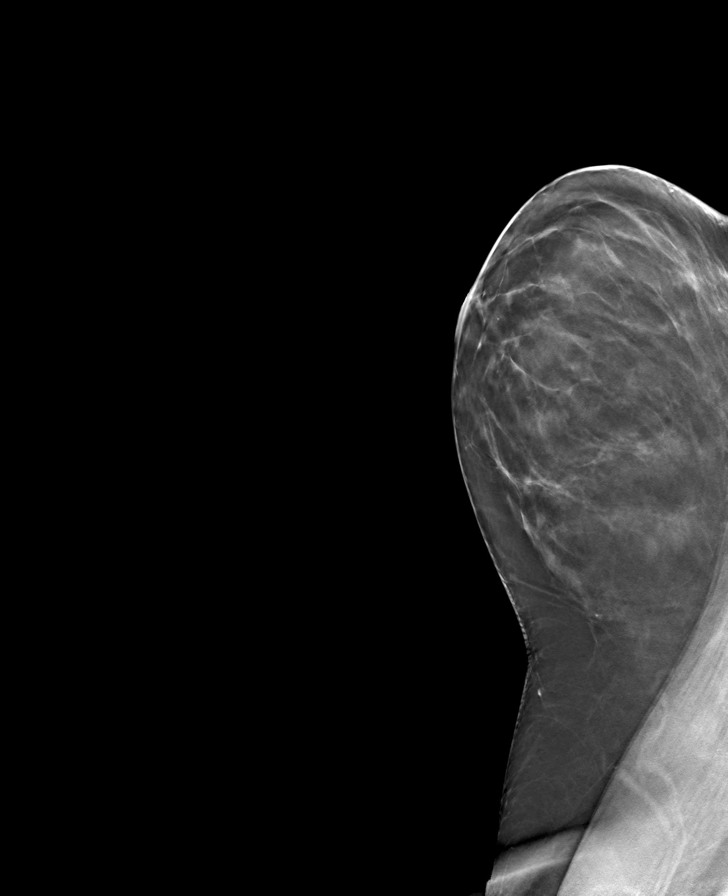

[R MLO tomo · tomo slice 41/81.0]
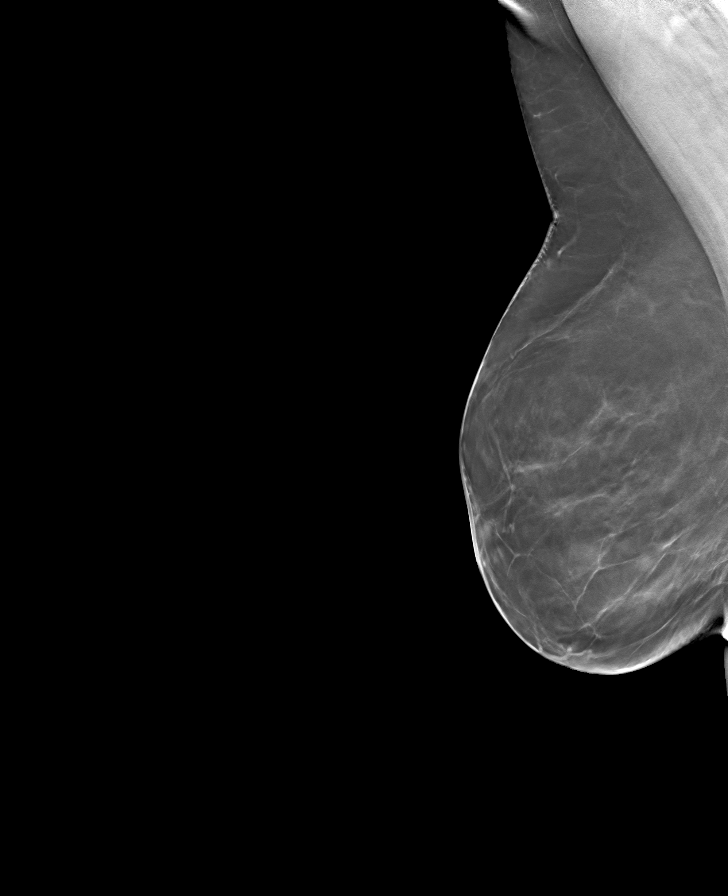

[R CC tomo · tomo slice 33/64.0]
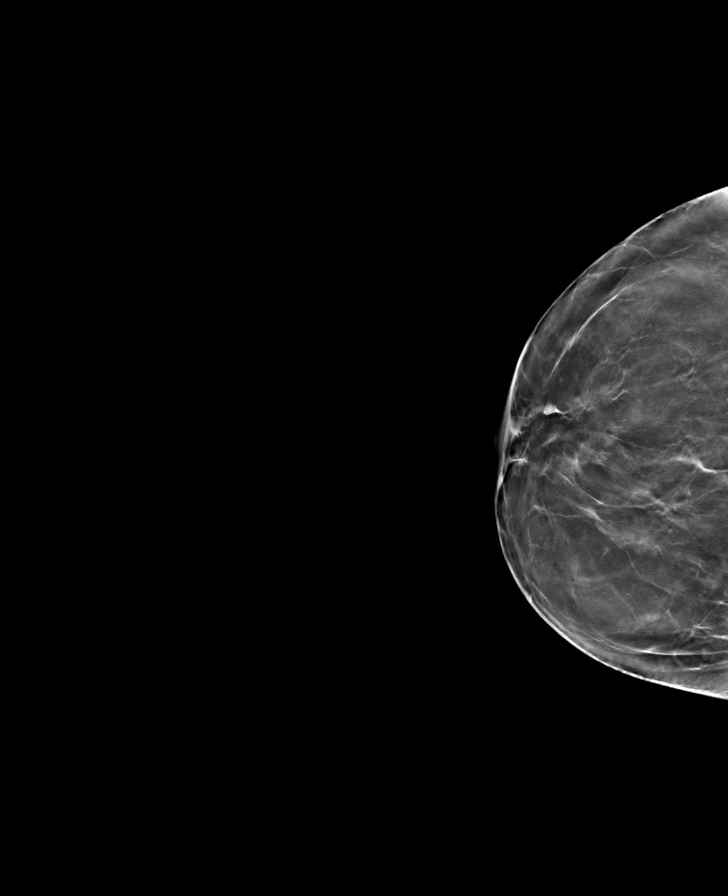

[L CC tomo · tomo slice 35/68.0]
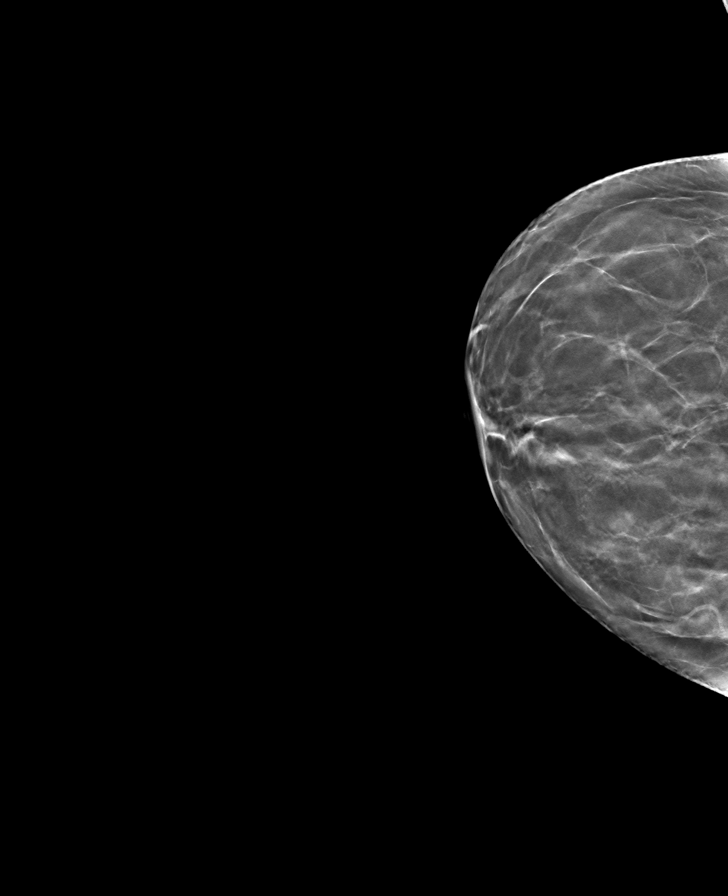

[8 of 24 positions shown; findings below may reference images not displayed]

ACR Breast Density Category b: There are scattered areas of
fibroglandular density.
FINDINGS: There are no findings suspicious for malignancy. Images were
processed with CAD.
IMPRESSION: No mammographic evidence of malignancy. A result letter of this
screening mammogram will be mailed directly to the patient.

RECOMMENDATION:
Screening mammogram in one year. (Code:CN-U-775)

BI-RADS CATEGORY  1: Negative.

## 2020-12-18 ENCOUNTER — Ambulatory Visit: Payer: BC Managed Care – PPO | Admitting: Podiatry

## 2020-12-18 ENCOUNTER — Ambulatory Visit (INDEPENDENT_AMBULATORY_CARE_PROVIDER_SITE_OTHER): Payer: BC Managed Care – PPO

## 2020-12-18 ENCOUNTER — Encounter: Payer: Self-pay | Admitting: Podiatry

## 2020-12-18 ENCOUNTER — Other Ambulatory Visit: Payer: Self-pay

## 2020-12-18 DIAGNOSIS — M79674 Pain in right toe(s): Secondary | ICD-10-CM

## 2020-12-18 DIAGNOSIS — M7751 Other enthesopathy of right foot: Secondary | ICD-10-CM

## 2020-12-18 MED ORDER — DEXAMETHASONE SODIUM PHOSPHATE 4 MG/ML IJ SOLN
2.0000 mg | Freq: Once | INTRAMUSCULAR | Status: AC
Start: 2020-12-18 — End: ?

## 2020-12-18 MED ORDER — TRIAMCINOLONE ACETONIDE 40 MG/ML IJ SUSP: 10.0000 mg | Freq: Once | INTRAMUSCULAR | Status: AC

## 2020-12-20 ENCOUNTER — Other Ambulatory Visit: Payer: Self-pay | Admitting: Podiatry

## 2020-12-20 DIAGNOSIS — M7751 Other enthesopathy of right foot: Secondary | ICD-10-CM

## 2020-12-22 NOTE — Progress Notes (Signed)
  Subjective:  Patient ID: Danielle Greene, female    DOB: [birth year],  MRN: [id number]  Chief Complaint  Patient presents with  . Toe Pain    Patient presents today for pain in right 4th toe x 1 month    50 y.o. female presents with the above complaint. History confirmed with patient.   Objective:  Physical Exam: warm, good capillary refill, no trophic changes or ulcerative lesions, normal DP and PT pulses and normal sensory exam.   Right Foot: Acute pain with plantarflexion and stretch of the fourth toe, no dislocation or plantar plate injury noted, no neuritic symptoms   Radiographs: X-ray of the right foot: no fracture, dislocation, swelling or degenerative changes noted Assessment:   1. Capsulitis of metatarsophalangeal (MTP) joint of right foot      Plan:  Patient was evaluated and treated and all questions answered.  Capsulitis -Educated on etiology -XR reviewed with patient -Injection delivered to the painful joint Procedure: Joint Injection Location: Right fourth toe metatarsophalangeal joint Skin Prep: Betadine. Injectate: 2 mg dexamethasone 10 mg Kenalog Disposition: Patient tolerated procedure well. Injection site dressed with a band-aid.   Return if symptoms worsen or fail to improve.

## 2021-03-31 ENCOUNTER — Other Ambulatory Visit: Payer: Self-pay

## 2021-03-31 NOTE — Telephone Encounter (Signed)
Patient made aware her Rx has been send and we have received a fax confirmation from pharmacy. Pt to call us back if pharmacy still does not have Rx.

## 2021-07-08 ENCOUNTER — Other Ambulatory Visit: Payer: Self-pay | Admitting: Certified Nurse Midwife

## 2021-07-08 DIAGNOSIS — Z1231 Encounter for screening mammogram for malignant neoplasm of breast: Secondary | ICD-10-CM

## 2021-08-04 ENCOUNTER — Ambulatory Visit
Admission: RE | Admit: 2021-08-04 | Discharge: 2021-08-04 | Disposition: A | Discharge status: Home / Self Care | Payer: BC Managed Care – PPO | Source: Ambulatory Visit | Attending: Certified Nurse Midwife | Admitting: Certified Nurse Midwife

## 2021-08-04 ENCOUNTER — Other Ambulatory Visit: Payer: Self-pay

## 2021-08-04 DIAGNOSIS — Z1231 Encounter for screening mammogram for malignant neoplasm of breast: Secondary | ICD-10-CM | POA: Diagnosis present

## 2021-08-27 ENCOUNTER — Other Ambulatory Visit (HOSPITAL_COMMUNITY)
Admission: RE | Admit: 2021-08-27 | Discharge: 2021-08-27 | Disposition: A | Discharge status: Home / Self Care | Payer: BC Managed Care – PPO | Source: Ambulatory Visit | Attending: Certified Nurse Midwife | Admitting: Certified Nurse Midwife

## 2021-08-27 ENCOUNTER — Encounter: Payer: Self-pay | Admitting: Certified Nurse Midwife

## 2021-08-27 ENCOUNTER — Other Ambulatory Visit: Payer: Self-pay

## 2021-08-27 ENCOUNTER — Ambulatory Visit (INDEPENDENT_AMBULATORY_CARE_PROVIDER_SITE_OTHER): Payer: BC Managed Care – PPO | Admitting: Certified Nurse Midwife

## 2021-08-27 VITALS — BP 115/77 | HR 58 | Ht 63.0 in | Wt 145.1 lb

## 2021-08-27 DIAGNOSIS — Z1272 Encounter for screening for malignant neoplasm of vagina: Secondary | ICD-10-CM

## 2021-08-27 DIAGNOSIS — Z01419 Encounter for gynecological examination (general) (routine) without abnormal findings: Secondary | ICD-10-CM | POA: Diagnosis present

## 2021-08-27 DIAGNOSIS — Z1231 Encounter for screening mammogram for malignant neoplasm of breast: Secondary | ICD-10-CM | POA: Diagnosis not present

## 2021-08-27 MED ORDER — ZOLPIDEM TARTRATE 10 MG PO TABS
10.0000 mg | ORAL_TABLET | Freq: Every evening | ORAL | 3 refills | Status: DC | PRN
Start: 2021-08-27 — End: 2022-05-26

## 2021-08-27 MED ORDER — ESTRADIOL 1 MG PO TABS: 1.0000 mg | ORAL_TABLET | Freq: Every day | ORAL | 11 refills | Status: DC

## 2021-08-27 NOTE — Progress Notes (Signed)
GYNECOLOGY ANNUAL PREVENTATIVE CARE ENCOUNTER NOTE  History:     Danielle Greene is a 50 y.o. G14P1001 female here for a routine annual gynecologic exam.  Current complaints: none.   Denies abnormal vaginal bleeding, discharge, pelvic pain, problems with intercourse or other gynecologic concerns.     Social Relationship: married Living:with Spouse Work: [employer] Exercise: treadmill 30-45 min 4-5 x wk Smoke/Alcohol/drug use: denies use  Gynecologic History Patient's last menstrual period was 10/24/2013. Contraception: status post hysterectomy Last Pap: 07/2016. Results were: normal with negative HPV Last mammogram: 07/2021. Results were: normal/  Obstetric History OB History  Gravida Para Term Preterm AB Living  1 1 1     1   SAB IAB Ectopic Multiple Live Births          1    # Outcome Date GA Lbr Len/2nd Weight Sex Delivery Anes PTL Lv  1 Term 2000    M Vag-Spont   LIV    Past Medical History:  Diagnosis Date   Insomnia     Past Surgical History:  Procedure Laterality Date   ABDOMINAL HYSTERECTOMY     BREAST EXCISIONAL BIOPSY Right 2010   neg   LEG SURGERY Right    COMPARTMENT SYNDROME   PUBOVAGINAL SLING N/A 08/09/2017   Procedure: URETHRAL SLING-TOT;  Surgeon: 14/05/2017, MD;  Location: [facility] ORS;  Service: Gynecology;  Laterality: N/A;    Current Outpatient Medications on File Prior to Visit  Medication Sig Dispense Refill   estradiol (ESTRACE) 1 MG tablet Take 1 tablet (1 mg total) by mouth daily. 30 tablet 11   zolpidem (AMBIEN) 10 MG tablet Take 1 tablet (10 mg total) by mouth at bedtime as needed for sleep. 30 tablet 3   Current Facility-Administered Medications on File Prior to Visit  Medication Dose Route Frequency Provider Last Rate Last Admin   dexamethasone (DECADRON) injection 2 mg  2 mg Other Once Linzie Collin, DPM       triamcinolone acetonide (KENALOG-40) injection 10 mg  10 mg Other Once Edwin Cap, DPM        Allergies   Allergen Reactions   Adhesive [Tape] Other (See Comments)    Turns red, skin raises up    Social History:  reports that she has never smoked. She has never used smokeless tobacco. She reports current alcohol use. She reports that she does not use drugs.  Family History  Problem Relation Age of Onset   Dementia Mother    Breast cancer Neg Hx    Ovarian cancer Neg Hx     The following portions of the patient's history were reviewed and updated as appropriate: allergies, current medications, past family history, past medical history, past social history, past surgical history and problem list.  Review of Systems Pertinent items noted in HPI and remainder of comprehensive ROS otherwise negative.  Physical Exam:  BP 115/77    Pulse (!) 58    Ht 5\' 3"  (1.6 m)    Wt 145 lb 1.6 oz (65.8 kg)    LMP 10/24/2013 Comment: Partial hysterectomy    BMI 25.70 kg/m  CONSTITUTIONAL: Well-developed, well-nourished female in no acute distress.  HENT:  Normocephalic, atraumatic, External right and left ear normal. Oropharynx is clear and moist EYES: Conjunctivae and EOM are normal. Pupils are equal, round, and reactive to light. No scleral icterus.  NECK: Normal range of motion, supple, no masses.  Normal thyroid.  SKIN: Skin is warm and dry. No rash  noted. Not diaphoretic. No erythema. No pallor. MUSCULOSKELETAL: Normal range of motion. No tenderness.  No cyanosis, clubbing, or edema.  2+ distal pulses. NEUROLOGIC: Alert and oriented to person, place, and time. Normal reflexes, muscle tone coordination.  PSYCHIATRIC: Normal mood and affect. Normal behavior. Normal judgment and thought content. CARDIOVASCULAR: Normal heart rate noted, regular rhythm RESPIRATORY: Clear to auscultation bilaterally. Effort and breath sounds normal, no problems with respiration noted. BREASTS: Symmetric in size. No masses, tenderness, skin changes, nipple drainage, or lymphadenopathy bilaterally.  ABDOMEN: Soft, no  distention noted.  No tenderness, rebound or guarding.  PELVIC: Normal appearing external genitalia and urethral meatus; normal appearing vaginal mucosa and vaginal cuff.  No abnormal discharge noted.  Pap smear obtained.  Uterus absent, no other palpable masses, no  adnexal tenderness.  .   Assessment and Plan:    1. Well woman exam with routine gynecological exam  Pap:Will follow up results of pap smear and manage accordingly. Mammogram :ordered Labs: none Refills: Estradiol, Ambien Referral: none Routine preventative health maintenance measures emphasized. Please refer to After Visit Summary for other counseling recommendations.      Doreene Burke, CNM [facility],  [facility]

## 2021-08-29 LAB — CYTOLOGY - PAP
Comment: NEGATIVE
Diagnosis: NEGATIVE
High risk HPV: NEGATIVE

## 2021-10-08 ENCOUNTER — Other Ambulatory Visit: Payer: Self-pay

## 2021-10-08 ENCOUNTER — Ambulatory Visit: Payer: BC Managed Care – PPO | Admitting: Podiatry

## 2021-10-08 DIAGNOSIS — M7751 Other enthesopathy of right foot: Secondary | ICD-10-CM

## 2021-10-13 ENCOUNTER — Encounter: Payer: Self-pay | Admitting: Podiatry

## 2021-10-13 NOTE — Progress Notes (Signed)
°  Subjective:  Patient ID: Danielle Greene, female    DOB: Sep 27, 1970,  MRN: [id number]  Chief Complaint  Patient presents with   Foot Pain    Patient havibng issues with Right foot 4th toe same as last year when she was here    51 y.o. female presents with the above complaint. History confirmed with patient.  Last injection helped quite a bit and was doing well until about a month ago  Objective:  Physical Exam: warm, good capillary refill, no trophic changes or ulcerative lesions, normal DP and PT pulses and normal sensory exam.   Right Foot: Acute pain with plantarflexion and stretch of the fourth toe, no dislocation or plantar plate injury noted, no neuritic symptoms   Radiographs: X-ray of the right foot: no fracture, dislocation, swelling or degenerative changes noted Assessment:   1. Capsulitis of metatarsophalangeal (MTP) joint of right foot      Plan:  Patient was evaluated and treated and all questions answered.  Capsulitis -Educated on etiology discussed if it becomes more consistent or frequent would consider an MRI to evaluate the joint -Repeat injection delivered to the painful joint Procedure: Joint Injection Location: Right fourth toe metatarsophalangeal joint Skin Prep: Betadine. Injectate: 2 mg dexamethasone 10 mg Kenalog Disposition: Patient tolerated procedure well. Injection site dressed with a band-aid.   Return if symptoms worsen or fail to improve.

## 2022-04-16 ENCOUNTER — Ambulatory Visit: Payer: BC Managed Care – PPO | Admitting: Podiatry

## 2022-04-16 DIAGNOSIS — G5761 Lesion of plantar nerve, right lower limb: Secondary | ICD-10-CM

## 2022-04-16 DIAGNOSIS — M7751 Other enthesopathy of right foot: Secondary | ICD-10-CM | POA: Diagnosis not present

## 2022-04-16 NOTE — Progress Notes (Signed)
  Subjective:  Patient ID: Danielle Greene, female    DOB: 21-Mar-1971,  MRN: [id number]  Chief Complaint  Patient presents with   Foot Pain    Right foot fourth toe pain.    51 y.o. female presents with the above complaint.  Patient presents with continuous pain to the right third interspace as well as the fourth MTP joint.  Patient states pain for touch is progressive gotten worse.  The injection helps however it keeps coming back.  She started make some shoe gear modification which helped some.  She wanted to get it evaluated.  She says Dr. Abbott Pao gave her a shot in the past which helped considerably.  However she states it just continues to hurt.  She denies any other acute complaints.  She wanted to discuss treatment options for this.   Review of Systems: Negative except as noted in the HPI. Denies N/V/F/Ch.  Past Medical History:  Diagnosis Date   Insomnia     Current Outpatient Medications:    estradiol (ESTRACE) 1 MG tablet, Take 1 tablet (1 mg total) by mouth daily., Disp: 30 tablet, Rfl: 11   zolpidem (AMBIEN) 10 MG tablet, Take 1 tablet (10 mg total) by mouth at bedtime as needed for sleep., Disp: 30 tablet, Rfl: 3  Current Facility-Administered Medications:    dexamethasone (DECADRON) injection 2 mg, 2 mg, Other, Once, McDonald, Adam R, DPM   triamcinolone acetonide (KENALOG-40) injection 10 mg, 10 mg, Other, Once, McDonald, Rachelle Hora, DPM  Social History   Tobacco Use  Smoking Status Never  Smokeless Tobacco Never    Allergies  Allergen Reactions   Adhesive [Tape] Other (See Comments)    Turns red, skin raises up   Objective:  There were no vitals filed for this visit. There is no height or weight on file to calculate BMI. Constitutional Well developed. Well nourished.  Vascular Dorsalis pedis pulses palpable bilaterally. Posterior tibial pulses palpable bilaterally. Capillary refill normal to all digits.  No cyanosis or clubbing noted. Pedal hair growth  normal.  Neurologic Normal speech. Oriented to person, place, and time. Epicritic sensation to light touch grossly present bilaterally.  Dermatologic Nails well groomed and normal in appearance. No open wounds. No skin lesions.  Orthopedic: Pain on palpation right third interspace Mulder click noted.  Mild pain with range of motion of the fourth MTPJ joint.  Pain on palpation to the MTPJ joint.  No pain at the rest of the metatarsophalangeal joints.  No pain at the other interim metatarsal spaces.  Palpable Mulder's click noted   Radiographs: None Assessment:   1. Capsulitis of metatarsophalangeal (MTP) joint of right foot   2. Neuroma, Morton's, right    Plan:  Patient was evaluated and treated and all questions answered.  Right third interspace neuroma versus capsulitis of the fourth MTP -Aqua shoes and concerns were discussed with the patient in extensive detail given the amount of pain she is having she will benefit from a steroid injection help decrease acute inflammatory component associate with pain.  Patient agrees with plan like to proceed with steroid injection. -A steroid injection was performed at right fourth MTP using 1% plain Lidocaine and 10 mg of Kenalog. This was well tolerated. -I agree and aggressively discussed shoe gear modification I gave her offloading pads/metatarsal pads to take the pressure away from forefoot.  If there is improvement we will discuss orthotics management during next clinical visit  No follow-ups on file.

## 2022-05-19 ENCOUNTER — Ambulatory Visit: Payer: BC Managed Care – PPO | Admitting: Podiatry

## 2022-05-25 ENCOUNTER — Encounter: Payer: Self-pay | Admitting: Certified Nurse Midwife

## 2022-05-26 ENCOUNTER — Other Ambulatory Visit: Payer: Self-pay | Admitting: Certified Nurse Midwife

## 2022-05-26 MED ORDER — ZOLPIDEM TARTRATE 10 MG PO TABS
10.0000 mg | ORAL_TABLET | Freq: Every evening | ORAL | 3 refills | Status: DC | PRN
Start: 2022-05-26 — End: 2022-09-07

## 2022-08-05 ENCOUNTER — Ambulatory Visit
Admission: RE | Admit: 2022-08-05 | Discharge: 2022-08-05 | Disposition: A | Discharge status: Home / Self Care | Payer: BC Managed Care – PPO | Source: Ambulatory Visit | Attending: Certified Nurse Midwife | Admitting: Certified Nurse Midwife

## 2022-08-05 DIAGNOSIS — Z01419 Encounter for gynecological examination (general) (routine) without abnormal findings: Secondary | ICD-10-CM

## 2022-08-05 DIAGNOSIS — Z1231 Encounter for screening mammogram for malignant neoplasm of breast: Secondary | ICD-10-CM | POA: Insufficient documentation

## 2022-08-19 ENCOUNTER — Telehealth: Payer: Self-pay | Admitting: Certified Nurse Midwife

## 2022-08-19 NOTE — Telephone Encounter (Signed)
Left message for patient to call office back to r/s her appt with AT on 09/07/2022

## 2022-09-02 ENCOUNTER — Other Ambulatory Visit: Payer: Self-pay | Admitting: Certified Nurse Midwife

## 2022-09-07 ENCOUNTER — Encounter: Payer: BC Managed Care – PPO | Admitting: Certified Nurse Midwife

## 2022-09-07 ENCOUNTER — Encounter: Payer: Self-pay | Admitting: Certified Nurse Midwife

## 2022-09-07 ENCOUNTER — Ambulatory Visit (INDEPENDENT_AMBULATORY_CARE_PROVIDER_SITE_OTHER): Payer: No Typology Code available for payment source | Admitting: Certified Nurse Midwife

## 2022-09-07 VITALS — BP 118/77 | HR 71 | Resp 16 | Ht 63.0 in | Wt 147.6 lb

## 2022-09-07 DIAGNOSIS — Z131 Encounter for screening for diabetes mellitus: Secondary | ICD-10-CM

## 2022-09-07 DIAGNOSIS — Z13220 Encounter for screening for lipoid disorders: Secondary | ICD-10-CM

## 2022-09-07 DIAGNOSIS — Z1211 Encounter for screening for malignant neoplasm of colon: Secondary | ICD-10-CM

## 2022-09-07 DIAGNOSIS — Z01419 Encounter for gynecological examination (general) (routine) without abnormal findings: Secondary | ICD-10-CM | POA: Diagnosis not present

## 2022-09-07 DIAGNOSIS — Z1329 Encounter for screening for other suspected endocrine disorder: Secondary | ICD-10-CM

## 2022-09-07 DIAGNOSIS — Z1231 Encounter for screening mammogram for malignant neoplasm of breast: Secondary | ICD-10-CM

## 2022-09-07 MED ORDER — ZOLPIDEM TARTRATE 5 MG PO TABS: 5.0000 mg | ORAL_TABLET | Freq: Every evening | ORAL | 5 refills | Status: DC | PRN

## 2022-09-07 MED ORDER — ESTRADIOL 1 MG PO TABS: 1.0000 mg | ORAL_TABLET | Freq: Every day | ORAL | 11 refills | Status: DC

## 2022-09-07 NOTE — Progress Notes (Signed)
GYNECOLOGY ANNUAL PREVENTATIVE CARE ENCOUNTER NOTE  History:     Danielle Greene is a 52 y.o. G74P1001 female here for a routine annual gynecologic exam.  Current complaints: difficulty losing weight.  Sleep disturbance due to menopause. Dyspareunia. Denies abnormal vaginal bleeding, discharge, pelvic pain, problems with intercourse or other gynecologic concerns.     Social Relationship: Married  Living: with spouse Work: [employer] Exercise: Treadmill 30-45 min 4-5x wk Smoke/Alcohol/drug use: denies use   Gynecologic History Patient's last menstrual period was 10/24/2013. Contraception: status post hysterectomy Last Pap: 08/07/2021. Results were: normal with negative HPV Last mammogram: 08/26/2022. Results were: normal  Obstetric History OB History  Gravida Para Term Preterm AB Living  1 1 1     1   SAB IAB Ectopic Multiple Live Births          1    # Outcome Date GA Lbr Len/2nd Weight Sex Delivery Anes PTL Lv  1 Term 2000    M Vag-Spont   LIV    Past Medical History:  Diagnosis Date   Insomnia     Past Surgical History:  Procedure Laterality Date   ABDOMINAL HYSTERECTOMY     BREAST EXCISIONAL BIOPSY Right 2010   neg   LEG SURGERY Right    COMPARTMENT SYNDROME   PUBOVAGINAL SLING N/A 08/09/2017   Procedure: URETHRAL SLING-TOT;  Surgeon: 14/05/2017, MD;  Location: [facility] ORS;  Service: Gynecology;  Laterality: N/A;    Current Outpatient Medications on File Prior to Visit  Medication Sig Dispense Refill   estradiol (ESTRACE) 1 MG tablet Take 1 tablet (1 mg total) by mouth daily. 30 tablet 3   zolpidem (AMBIEN) 10 MG tablet Take 1 tablet (10 mg total) by mouth at bedtime as needed for sleep. 30 tablet 3   Current Facility-Administered Medications on File Prior to Visit  Medication Dose Route Frequency Provider Last Rate Last Admin   dexamethasone (DECADRON) injection 2 mg  2 mg Other Once Linzie Collin, DPM       triamcinolone acetonide  (KENALOG-40) injection 10 mg  10 mg Other Once Edwin Cap, DPM        Allergies  Allergen Reactions   Adhesive [Tape] Other (See Comments)    Turns red, skin raises up    Social History:  reports that she has never smoked. She has never used smokeless tobacco. She reports current alcohol use. She reports that she does not use drugs.  Family History  Problem Relation Age of Onset   Dementia Mother    Breast cancer Neg Hx    Ovarian cancer Neg Hx     The following portions of the patient's history were reviewed and updated as appropriate: allergies, current medications, past family history, past medical history, past social history, past surgical history and problem list.  Review of Systems Pertinent items noted in HPI and remainder of comprehensive ROS otherwise negative.  Physical Exam:  BP 118/77   Pulse 71   Resp 16   Ht 5\' 3"  (1.6 m)   Wt 147 lb 9.6 oz (67 kg)   LMP 10/24/2013 Comment: Partial hysterectomy   BMI 26.15 kg/m  CONSTITUTIONAL: Well-developed, well-nourished female in no acute distress.  HENT:  Normocephalic, atraumatic, External right and left ear normal. Oropharynx is clear and moist EYES: Conjunctivae and EOM are normal. Pupils are equal, round, and reactive to light. No scleral icterus.  NECK: Normal range of motion, supple, no masses.  Normal thyroid.  SKIN: Skin is warm and dry. No rash noted. Not diaphoretic. No erythema. No pallor. MUSCULOSKELETAL: Normal range of motion. No tenderness.  No cyanosis, clubbing, or edema.  2+ distal pulses. NEUROLOGIC: Alert and oriented to person, place, and time. Normal reflexes, muscle tone coordination.  PSYCHIATRIC: Normal mood and affect. Normal behavior. Normal judgment and thought content. CARDIOVASCULAR: Normal heart rate noted, regular rhythm RESPIRATORY: Clear to auscultation bilaterally. Effort and breath sounds normal, no problems with respiration noted. BREASTS: Symmetric in size. No masses,  tenderness, skin changes, nipple drainage, or lymphadenopathy bilaterally.  ABDOMEN: Soft, no distention noted.  No tenderness, rebound or guarding.  PELVIC: Normal appearing external genitalia and urethral meatus; normal appearing vaginal mucosa. Cervix absent  No abnormal discharge noted.  Pap smear not due    uterus absent, no other palpable masses, no  adnexal tenderness.  .   Assessment and Plan:    1. Women's annual routine gynecological examination    Pap: not due Mammogram : ordered Labs: Lipid, TSH, HemA!C Refills: Estrace , Ambien changed to 5 mg.  Referral: none Routine preventative health maintenance measures emphasized. Discussed change in exercise to strength training to increase muscle mass, thus increasing calories burned daily.  Please refer to After Visit Summary for other counseling recommendations.      Philip Aspen, [facility]  [facility]

## 2022-09-07 NOTE — Patient Instructions (Signed)

## 2022-09-08 ENCOUNTER — Encounter: Payer: Self-pay | Admitting: Certified Nurse Midwife

## 2022-09-08 LAB — THYROID PANEL WITH TSH
Free Thyroxine Index: 1.7 (ref 1.2–4.9)
T3 Uptake Ratio: 19 % — ABNORMAL LOW (ref 24–39)
T4, Total: 9.1 ug/dL (ref 4.5–12.0)
TSH: 1.83 u[IU]/mL (ref 0.450–4.500)

## 2022-09-08 LAB — HEMOGLOBIN A1C
Est. average glucose Bld gHb Est-mCnc: 105 mg/dL
Hgb A1c MFr Bld: 5.3 % (ref 4.8–5.6)

## 2022-09-08 LAB — LIPID PANEL
Chol/HDL Ratio: 3.1 ratio (ref 0.0–4.4)
Cholesterol, Total: 246 mg/dL — ABNORMAL HIGH (ref 100–199)
HDL: 79 mg/dL (ref >39)
LDL Chol Calc (NIH): 148 mg/dL — ABNORMAL HIGH (ref 0–99)
Triglycerides: 112 mg/dL (ref 0–149)
VLDL Cholesterol Cal: 19 mg/dL (ref 5–40)

## 2022-09-22 LAB — COLOGUARD: COLOGUARD: NEGATIVE

## 2022-10-14 ENCOUNTER — Encounter: Payer: Self-pay | Admitting: Certified Nurse Midwife

## 2022-10-16 ENCOUNTER — Other Ambulatory Visit: Payer: Self-pay | Admitting: Certified Nurse Midwife

## 2022-10-16 MED ORDER — ZOLPIDEM TARTRATE 10 MG PO TABS: 10.0000 mg | ORAL_TABLET | Freq: Every evening | ORAL | 5 refills | Status: DC | PRN

## 2022-12-01 ENCOUNTER — Encounter: Payer: Self-pay | Admitting: Certified Nurse Midwife

## 2023-02-02 ENCOUNTER — Ambulatory Visit: Payer: 59 | Admitting: Podiatry

## 2023-02-02 DIAGNOSIS — M7751 Other enthesopathy of right foot: Secondary | ICD-10-CM

## 2023-02-02 NOTE — Progress Notes (Signed)
  Subjective:  Patient ID: Danielle Greene, female    DOB: 18-Jan-1971,  MRN: [id number]  Chief Complaint  Patient presents with   Foot Pain    Pt stated that she has some discomfort on the top of her foot and under her toes it feels like a bruise     52 y.o. female presents with the above complaint.  Patient presents with right fourth metatarsophalangeal joint capsulitis.  Patient states the injection helped considerably but the pain has started coming back.  It feels like walking on toes bruising.   Review of Systems: Negative except as noted in the HPI. Denies N/V/F/Ch.  Past Medical History:  Diagnosis Date   Insomnia     Current Outpatient Medications:    zolpidem (AMBIEN) 10 MG tablet, Take 1 tablet (10 mg total) by mouth at bedtime as needed for sleep., Disp: 15 tablet, Rfl: 5   estradiol (ESTRACE) 1 MG tablet, Take 1 tablet (1 mg total) by mouth daily., Disp: 30 tablet, Rfl: 11  Current Facility-Administered Medications:    dexamethasone (DECADRON) injection 2 mg, 2 mg, Other, Once, McDonald, Adam R, DPM   triamcinolone acetonide (KENALOG-40) injection 10 mg, 10 mg, Other, Once, McDonald, Rachelle Hora, DPM  Social History   Tobacco Use  Smoking Status Never  Smokeless Tobacco Never    Allergies  Allergen Reactions   Adhesive [Tape] Other (See Comments)    Turns red, skin raises up   Objective:  There were no vitals filed for this visit. There is no height or weight on file to calculate BMI. Constitutional Well developed. Well nourished.  Vascular Dorsalis pedis pulses palpable bilaterally. Posterior tibial pulses palpable bilaterally. Capillary refill normal to all digits.  No cyanosis or clubbing noted. Pedal hair growth normal.  Neurologic Normal speech. Oriented to person, place, and time. Epicritic sensation to light touch grossly present bilaterally.  Dermatologic Nails well groomed and normal in appearance. No open wounds. No skin lesions.   Orthopedic: Pain on palpation right third interspace Mulder click noted.  Mild pain with range of motion of the fourth MTPJ joint.  Pain on palpation to the MTPJ joint.  No pain at the rest of the metatarsophalangeal joints.  No pain at the other interim metatarsal spaces.  Palpable Mulder's click noted   Radiographs: None Assessment:   1. Capsulitis of metatarsophalangeal (MTP) joint of right foot     Plan:  Patient was evaluated and treated and all questions answered.  Right third interspace neuroma versus capsulitis of the fourth MTP -Aqua shoes and concerns were discussed with the patient in extensive detail given the amount of pain she is having she will benefit from a steroid injection help decrease acute inflammatory component associate with pain.  Patient agrees with plan like to proceed with steroid injection. -A steroid injection was performed at right fourth MTP using 1% plain Lidocaine and 10 mg of Kenalog. This was well tolerated. -I clinically the metatarsal pad has not helped.  Steroid injection helps.  No follow-ups on file.

## 2023-04-16 ENCOUNTER — Other Ambulatory Visit: Payer: Self-pay | Admitting: Certified Nurse Midwife

## 2023-04-16 ENCOUNTER — Telehealth: Payer: Self-pay

## 2023-04-16 MED ORDER — ZOLPIDEM TARTRATE 10 MG PO TABS: 10.0000 mg | ORAL_TABLET | Freq: Every evening | ORAL | 5 refills | Status: DC | PRN

## 2023-04-16 NOTE — Telephone Encounter (Signed)
Patient is calling because she is out of her Ambien, she will be going out of town tomorrow and is requesting to have this refilled asap. Please advise.

## 2023-04-16 NOTE — Telephone Encounter (Signed)
Patient called.  Patient aware.  

## 2023-08-09 ENCOUNTER — Ambulatory Visit: Payer: No Typology Code available for payment source | Admitting: Podiatry

## 2023-08-17 ENCOUNTER — Ambulatory Visit
Admission: RE | Admit: 2023-08-17 | Discharge: 2023-08-17 | Disposition: A | Discharge status: Home / Self Care | Payer: No Typology Code available for payment source | Source: Ambulatory Visit | Attending: Certified Nurse Midwife | Admitting: Certified Nurse Midwife

## 2023-08-17 DIAGNOSIS — Z01419 Encounter for gynecological examination (general) (routine) without abnormal findings: Secondary | ICD-10-CM

## 2023-08-17 DIAGNOSIS — Z1231 Encounter for screening mammogram for malignant neoplasm of breast: Secondary | ICD-10-CM | POA: Insufficient documentation

## 2023-09-22 ENCOUNTER — Encounter: Payer: Self-pay | Admitting: Certified Nurse Midwife

## 2023-09-22 ENCOUNTER — Ambulatory Visit: Payer: No Typology Code available for payment source | Admitting: Certified Nurse Midwife

## 2023-09-22 VITALS — BP 123/81 | HR 56 | Ht 63.0 in | Wt 154.8 lb

## 2023-09-22 DIAGNOSIS — Z01419 Encounter for gynecological examination (general) (routine) without abnormal findings: Secondary | ICD-10-CM

## 2023-09-22 DIAGNOSIS — Z1231 Encounter for screening mammogram for malignant neoplasm of breast: Secondary | ICD-10-CM

## 2023-09-22 MED ORDER — ESTRADIOL 1 MG PO TABS: 1.0000 mg | ORAL_TABLET | Freq: Every day | ORAL | 11 refills | Status: DC

## 2023-09-22 MED ORDER — ZOLPIDEM TARTRATE 10 MG PO TABS: 10.0000 mg | ORAL_TABLET | Freq: Every evening | ORAL | 5 refills | Status: DC | PRN

## 2023-09-22 NOTE — Patient Instructions (Signed)
Preventive Care 40-52 Years Old, Female Preventive care refers to lifestyle choices and visits with your health care provider that can promote health and wellness. Preventive care visits are also called wellness exams. What can I expect for my preventive care visit? Counseling Your health care provider may ask you questions about your: Medical history, including: Past medical problems. Family medical history. Pregnancy history. Current health, including: Menstrual cycle. Method of birth control. Emotional well-being. Home life and relationship well-being. Sexual activity and sexual health. Lifestyle, including: Alcohol, nicotine or tobacco, and drug use. Access to firearms. Diet, exercise, and sleep habits. Work and work environment. Sunscreen use. Safety issues such as seatbelt and bike helmet use. Physical exam Your health care provider will check your: Height and weight. These may be used to calculate your BMI (body mass index). BMI is a measurement that tells if you are at a healthy weight. Waist circumference. This measures the distance around your waistline. This measurement also tells if you are at a healthy weight and may help predict your risk of certain diseases, such as type 2 diabetes and high blood pressure. Heart rate and blood pressure. Body temperature. Skin for abnormal spots. What immunizations do I need?  Vaccines are usually given at various ages, according to a schedule. Your health care provider will recommend vaccines for you based on your age, medical history, and lifestyle or other factors, such as travel or where you work. What tests do I need? Screening Your health care provider may recommend screening tests for certain conditions. This may include: Lipid and cholesterol levels. Diabetes screening. This is done by checking your blood sugar (glucose) after you have not eaten for a while (fasting). Pelvic exam and Pap test. Hepatitis B test. Hepatitis C  test. HIV (human immunodeficiency virus) test. STI (sexually transmitted infection) testing, if you are at risk. Lung cancer screening. Colorectal cancer screening. Mammogram. Talk with your health care provider about when you should start having regular mammograms. This may depend on whether you have a family history of breast cancer. BRCA-related cancer screening. This may be done if you have a family history of breast, ovarian, tubal, or peritoneal cancers. Bone density scan. This is done to screen for osteoporosis. Talk with your health care provider about your test results, treatment options, and if necessary, the need for more tests. Follow these instructions at home: Eating and drinking  Eat a diet that includes fresh fruits and vegetables, whole grains, lean protein, and low-fat dairy products. Take vitamin and mineral supplements as recommended by your health care provider. Do not drink alcohol if: Your health care provider tells you not to drink. You are pregnant, may be pregnant, or are planning to become pregnant. If you drink alcohol: Limit how much you have to 0-1 drink a day. Know how much alcohol is in your drink. In the [place] one drink equals one 12 oz bottle of beer (355 mL), one 5 oz glass of wine (148 mL), or one 1 oz glass of hard liquor (44 mL). Lifestyle Brush your teeth every morning and night with fluoride toothpaste. Floss one time each day. Exercise for at least 30 minutes 5 or more days each week. Do not use any products that contain nicotine or tobacco. These products include cigarettes, chewing tobacco, and vaping devices, such as e-cigarettes. If you need help quitting, ask your health care provider. Do not use drugs. If you are sexually active, practice safe sex. Use a condom or other form of protection to   prevent STIs. If you do not wish to become pregnant, use a form of birth control. If you plan to become pregnant, see your health care provider for a  prepregnancy visit. Take aspirin only as told by your health care provider. Make sure that you understand how much to take and what form to take. Work with your health care provider to find out whether it is safe and beneficial for you to take aspirin daily. Find healthy ways to manage stress, such as: Meditation, yoga, or listening to music. Journaling. Talking to a trusted person. Spending time with friends and family. Minimize exposure to UV radiation to reduce your risk of skin cancer. Safety Always wear your seat belt while driving or riding in a vehicle. Do not drive: If you have been drinking alcohol. Do not ride with someone who has been drinking. When you are tired or distracted. While texting. If you have been using any mind-altering substances or drugs. Wear a helmet and other protective equipment during sports activities. If you have firearms in your house, make sure you follow all gun safety procedures. Seek help if you have been physically or sexually abused. What's next? Visit your health care provider once a year for an annual wellness visit. Ask your health care provider how often you should have your eyes and teeth checked. Stay up to date on all vaccines. This information is not intended to replace advice given to you by your health care provider. Make sure you discuss any questions you have with your health care provider. Document Revised: 02/12/2021 Document Reviewed: 02/12/2021 Elsevier Patient Education  2024 Elsevier Inc.  

## 2023-09-22 NOTE — Progress Notes (Signed)
GYNECOLOGY ANNUAL PREVENTATIVE CARE ENCOUNTER NOTE  History:     Danielle Greene is a 53 y.o. G36P1001 female here for a routine annual gynecologic exam.  Current complaints: continues to have insomnia , Remus Loffler does help.   Denies abnormal vaginal bleeding, discharge, pelvic pain, problems with intercourse or other gynecologic concerns.     Social Relationship: married  Living: with spouse  Work: [employer] Exercise: treadmill/gym few times a week  Smoke/Alcohol/drug use: denies use   Gynecologic History Patient's last menstrual period was 10/24/2013. Contraception:  partial hysterectomy  Last Pap: 08/07/2021. Results were: normal with negative HPV Last mammogram: 08/17/2023. Results were: normal  Obstetric History OB History  Gravida Para Term Preterm AB Living  1 1 1   1   SAB IAB Ectopic Multiple Live Births      1    # Outcome Date GA Lbr Len/2nd Weight Sex Type Anes PTL Lv  1 Term 2000    M Vag-Spont   LIV    Past Medical History:  Diagnosis Date   Insomnia     Past Surgical History:  Procedure Laterality Date   ABDOMINAL HYSTERECTOMY     BREAST EXCISIONAL BIOPSY Right 2010   neg   LEG SURGERY Right    COMPARTMENT SYNDROME   PUBOVAGINAL SLING N/A 08/09/2017   Procedure: URETHRAL SLING-TOT;  Surgeon: Linzie Collin, MD;  Location: [facility] ORS;  Service: Gynecology;  Laterality: N/A;    Current Outpatient Medications on File Prior to Visit  Medication Sig Dispense Refill   estradiol (ESTRACE) 1 MG tablet Take 1 tablet (1 mg total) by mouth daily. 30 tablet 11   zolpidem (AMBIEN) 10 MG tablet Take 1 tablet (10 mg total) by mouth at bedtime as needed for sleep. 15 tablet 5   Current Facility-Administered Medications on File Prior to Visit  Medication Dose Route Frequency Provider Last Rate Last Admin   dexamethasone (DECADRON) injection 2 mg  2 mg Other Once Edwin Cap, DPM       triamcinolone acetonide (KENALOG-40) injection 10 mg  10 mg Other  Once Edwin Cap, DPM        Allergies  Allergen Reactions   Adhesive [Tape] Other (See Comments)    Turns red, skin raises up    Social History:  reports that she has never smoked. She has never used smokeless tobacco. She reports current alcohol use. She reports that she does not use drugs.  Family History  Problem Relation Age of Onset   Dementia Mother    Breast cancer Neg Hx    Ovarian cancer Neg Hx     The following portions of the patient's history were reviewed and updated as appropriate: allergies, current medications, past family history, past medical history, past social history, past surgical history and problem list.  Review of Systems Pertinent items noted in HPI and remainder of comprehensive ROS otherwise negative.  Physical Exam:  BP 123/81   Pulse (!) 56   Ht 5\' 3"  (1.6 m)   Wt 154 lb 12.8 oz (70.2 kg)   LMP 10/24/2013 Comment: Partial hysterectomy   BMI 27.42 kg/m  CONSTITUTIONAL: Well-developed, well-nourished female in no acute distress.  HENT:  Normocephalic, atraumatic, External right and left ear normal. Oropharynx is clear and moist EYES: Conjunctivae and EOM are normal. Pupils are equal, round, and reactive to light. No scleral icterus.  NECK: Normal range of motion, supple, no masses.  Normal thyroid.  SKIN: Skin is  warm and dry. No rash noted. Not diaphoretic. No erythema. No pallor. MUSCULOSKELETAL: Normal range of motion. No tenderness.  No cyanosis, clubbing, or edema.  2+ distal pulses. NEUROLOGIC: Alert and oriented to person, place, and time. Normal reflexes, muscle tone coordination.  PSYCHIATRIC: Normal mood and affect. Normal behavior. Normal judgment and thought content. CARDIOVASCULAR: Normal heart rate noted, regular rhythm RESPIRATORY: Clear to auscultation bilaterally. Effort and breath sounds normal, no problems with respiration noted. BREASTS: Symmetric in size. No masses, tenderness, skin changes, nipple drainage, or  lymphadenopathy bilaterally.  ABDOMEN: Soft, no distention noted.  No tenderness, rebound or guarding.  PELVIC: Normal appearing external genitalia and urethral meatus; normal appearing vaginal mucosa and cervix.  No abnormal discharge noted.  Pap smear not due.  l uterine absent. no other palpable masses, no adnexal tenderness.  .   Assessment and Plan:    1. Women's annual routine gynecological examination (Primary)  Pap: not due  Mammogram : ordered Labs: none Refills:Estradiol, Ambien  Referral: none  Routine preventative health maintenance measures emphasized. Please refer to After Visit Summary for other counseling recommendations.      Doreene Burke, CNM [facility]  [facility],  [facility]

## 2024-04-10 ENCOUNTER — Other Ambulatory Visit: Payer: Self-pay | Admitting: Certified Nurse Midwife

## 2024-04-10 MED ORDER — ZOLPIDEM TARTRATE 10 MG PO TABS: 10.0000 mg | ORAL_TABLET | Freq: Every evening | ORAL | 5 refills | Status: DC | PRN

## 2024-05-02 ENCOUNTER — Ambulatory Visit: Admitting: Licensed Practical Nurse

## 2024-05-03 ENCOUNTER — Ambulatory Visit: Admitting: Licensed Practical Nurse

## 2024-08-08 ENCOUNTER — Ambulatory Visit: Admitting: Podiatry

## 2024-08-08 DIAGNOSIS — L600 Ingrowing nail: Secondary | ICD-10-CM | POA: Diagnosis not present

## 2024-08-08 NOTE — Progress Notes (Unsigned)
°  Subjective:  Patient ID: Danielle Greene, female    DOB: March 29, 1971,  MRN: [id number]  Chief Complaint  Patient presents with   Nail Problem    Pt stated that it feels like there is a nail under her skin she can not see it but she has a lot of discomfort at the base of her nail     53 y.o. female presents with the above complaint.  Patient presents with right hallux medial border ingrown painful to touch as well as ecchymosis and abrasions with pressure would like to have removed has not seen MRIs prior to seeing me pain scale 7 out of 10 dull aching nature.   Review of Systems: Negative except as noted in the HPI. Denies N/V/F/Ch.  Past Medical History:  Diagnosis Date   Insomnia    Current Medications[1]  Tobacco Use History[2]  Allergies[3] Objective:  There were no vitals filed for this visit. There is no height or weight on file to calculate BMI. Constitutional Well developed. Well nourished.  Vascular Dorsalis pedis pulses palpable bilaterally. Posterior tibial pulses palpable bilaterally. Capillary refill normal to all digits.  No cyanosis or clubbing noted. Pedal hair growth normal.  Neurologic Normal speech. Oriented to person, place, and time. Epicritic sensation to light touch grossly present bilaterally.  Dermatologic Painful ingrowing nail at medial nail borders of the hallux nail right. No other open wounds. No skin lesions.  Orthopedic: Normal joint ROM without pain or crepitus bilaterally. No visible deformities. No bony tenderness.   Radiographs: None Assessment:   1. Ingrown toenail of right foot    Plan:  Patient was evaluated and treated and all questions answered.  Ingrown Nail, right -Patient elects to proceed with minor surgery to remove ingrown toenail removal today. Consent reviewed and signed by patient. -Ingrown nail excised. See procedure note. -Educated on post-procedure care including soaking. Written instructions provided and  reviewed. -Patient to follow up in 2 weeks for nail check.  Procedure: Excision of Ingrown Toenail Location: Right 1st toe medial nail borders. Anesthesia: Lidocaine  1% plain; 1.5 mL and Marcaine 0.5% plain; 1.5 mL, digital block. Skin Prep: Betadine. Dressing: Silvadene; telfa; dry, sterile, compression dressing. Technique: Following skin prep, the toe was exsanguinated and a tourniquet was secured at the base of the toe. The affected nail border was freed, split with a nail splitter, and excised. Chemical matrixectomy was then performed with phenol and irrigated out with alcohol. The tourniquet was then removed and sterile dressing applied. Disposition: Patient tolerated procedure well. Patient to return in 2 weeks for follow-up.   No follow-ups on file.    [1]  Current Outpatient Medications:    RINVOQ 15 MG TB24, Take 1 tablet by mouth daily., Disp: , Rfl:    estradiol  (ESTRACE ) 1 MG tablet, Take 1 tablet (1 mg total) by mouth daily., Disp: 30 tablet, Rfl: 11   zolpidem  (AMBIEN ) 10 MG tablet, Take 1 tablet (10 mg total) by mouth at bedtime as needed for sleep., Disp: 15 tablet, Rfl: 5  Current Facility-Administered Medications:    dexamethasone  (DECADRON ) injection 2 mg, 2 mg, Other, Once, McDonald, Adam R, DPM   triamcinolone  acetonide (KENALOG -40) injection 10 mg, 10 mg, Other, Once, McDonald, Juliene SAUNDERS, DPM [2]  Social History Tobacco Use  Smoking Status Never  Smokeless Tobacco Never  [3]  Allergies Allergen Reactions   Adhesive [Tape] Other (See Comments)    Turns red, skin raises up

## 2024-08-08 NOTE — Patient Instructions (Signed)

## 2024-08-17 ENCOUNTER — Ambulatory Visit
Admission: RE | Admit: 2024-08-17 | Discharge: 2024-08-17 | Disposition: A | Discharge status: Home / Self Care | Source: Ambulatory Visit | Attending: Certified Nurse Midwife | Admitting: Certified Nurse Midwife

## 2024-08-17 DIAGNOSIS — Z1231 Encounter for screening mammogram for malignant neoplasm of breast: Secondary | ICD-10-CM | POA: Diagnosis present

## 2024-08-17 DIAGNOSIS — Z01419 Encounter for gynecological examination (general) (routine) without abnormal findings: Secondary | ICD-10-CM

## 2024-09-11 ENCOUNTER — Other Ambulatory Visit (HOSPITAL_COMMUNITY)
Admission: RE | Admit: 2024-09-11 | Discharge: 2024-09-11 | Disposition: A | Discharge status: Home / Self Care | Source: Ambulatory Visit | Attending: Certified Nurse Midwife | Admitting: Certified Nurse Midwife

## 2024-09-11 ENCOUNTER — Ambulatory Visit (INDEPENDENT_AMBULATORY_CARE_PROVIDER_SITE_OTHER): Payer: Self-pay | Admitting: Certified Nurse Midwife

## 2024-09-11 ENCOUNTER — Encounter: Payer: Self-pay | Admitting: Certified Nurse Midwife

## 2024-09-11 VITALS — BP 162/90 | HR 69 | Wt 152.2 lb

## 2024-09-11 DIAGNOSIS — N9489 Other specified conditions associated with female genital organs and menstrual cycle: Secondary | ICD-10-CM

## 2024-09-11 DIAGNOSIS — N898 Other specified noninflammatory disorders of vagina: Secondary | ICD-10-CM | POA: Diagnosis not present

## 2024-09-11 MED ORDER — VALACYCLOVIR HCL 1 G PO TABS: 1000.0000 mg | ORAL_TABLET | Freq: Two times a day (BID) | ORAL | 1 refills | Status: DC

## 2024-09-11 MED ORDER — VALACYCLOVIR HCL 1 G PO TABS: 1000.0000 mg | ORAL_TABLET | Freq: Two times a day (BID) | ORAL | 1 refills | Status: AC

## 2024-09-11 NOTE — Patient Instructions (Signed)
  Vaginitis You will learn about the causes symptoms, and treatment for the 3 main types of vaginitis; vaginosis, yeast infection and trichomoniasis. To view the content, go to this web address: [email or web address]  This video will expire on: 08/11/2025. If you need access to this video following this date, please reach out to the healthcare provider who assigned it to you. This information is not intended to replace advice given to you by your health care provider. Make sure you discuss any questions you have with your health care provider. Elsevier Patient Education  2024 [employer].

## 2024-09-11 NOTE — Addendum Note (Signed)
 Addended by: DELANA SAUER on: 09/11/2024 02:28 PM   Modules accepted: Orders

## 2024-09-11 NOTE — Progress Notes (Signed)
 GYN ENCOUNTER NOTE  Subjective:       Danielle Greene is a 54 y.o. G71P1001 female is here for gynecologic evaluation of the following issues:  1. Patient presents in office today with complaint of vaginal burning for one week.Patient denies odor, itching or discharge she does state in the past week she has changed body soaps.  She is tearful, as she is worried.    Gynecologic History Patient's last menstrual period was 10/24/2013. Contraception:Hysterectomy  Last Pap: 08/27/21. Results were: normal Last mammogram: 08/17/24. Results were: normal  Obstetric History OB History  Gravida Para Term Preterm AB Living  1 1 1   1   SAB IAB Ectopic Multiple Live Births      1    # Outcome Date GA Lbr Len/2nd Weight Sex Type Anes PTL Lv  1 Term 2000    M Vag-Spont   LIV    Past Medical History:  Diagnosis Date   Insomnia     Past Surgical History:  Procedure Laterality Date   ABDOMINAL HYSTERECTOMY     BREAST EXCISIONAL BIOPSY Right 2010   neg   LEG SURGERY Right    COMPARTMENT SYNDROME   PUBOVAGINAL SLING N/A 08/09/2017   Procedure: URETHRAL SLING-TOT;  Surgeon: Janit Alm Agent, MD;  Location: [facility] ORS;  Service: Gynecology;  Laterality: N/A;    Medications Ordered Prior to Encounter[1]  Allergies[2]  Social History   Socioeconomic History   Marital status: Married    Spouse name: Not on file   Number of children: Not on file   Years of education: Not on file   Highest education level: Not on file  Occupational History   Not on file  Tobacco Use   Smoking status: Never   Smokeless tobacco: Never  Vaping Use   Vaping status: Never Used  Substance and Sexual Activity   Alcohol use: Yes    Comment: [facility]   Drug use: No   Sexual activity: Yes    Birth control/protection: Surgical  Other Topics Concern   Not on file  Social History Narrative   Not on file   Social Drivers of Health   Tobacco Use: Low Risk (09/22/2023)   Patient History    Smoking Tobacco  Use: Never    Smokeless Tobacco Use: Never    Passive Exposure: Not on file  Financial Resource Strain: Not on file  Food Insecurity: Not on file  Transportation Needs: Not on file  Physical Activity: Not on file  Stress: Not on file  Social Connections: Not on file  Intimate Partner Violence: Not on file  Depression (EYV7-0): Not on file  Alcohol Screen: Not on file  Housing: Not on file  Utilities: Not on file  Health Literacy: Not on file    Family History  Problem Relation Age of Onset   Dementia Mother    Breast cancer Neg Hx    Ovarian cancer Neg Hx     The following portions of the patient's history were reviewed and updated as appropriate: allergies, current medications, past family history, past medical history, past social history, past surgical history and problem list.  Review of Systems Review of Systems - Negative except as mentioned in HPI Review of Systems - General ROS: negative for - chills, fatigue, fever, hot flashes, malaise or night sweats Hematological and Lymphatic ROS: negative for - bleeding problems or swollen lymph nodes Gastrointestinal ROS: negative for - abdominal pain, blood in stools, change in bowel habits and nausea/vomiting Musculoskeletal ROS:  negative for - joint pain, muscle pain or muscular weakness Genito-Urinary ROS: negative for - change in menstrual cycle, dysmenorrhea, dyspareunia, dysuria, genital discharge, genital ulcers, hematuria, incontinence, irregular/heavy menses, nocturia or pelvic painjj  Objective:   BP (!) 159/84   Pulse 72   Wt 152 lb 3.2 oz (69 kg)   LMP 10/24/2013 Comment: Partial hysterectomy   BMI 26.96 kg/m  CONSTITUTIONAL: Well-developed, well-nourished female in no acute distress.  HENT:  Normocephalic, atraumatic.  NECK: Normal range of motion, supple, no masses.  Normal thyroid .  SKIN: Skin is warm and dry. No rash noted. Not diaphoretic. No erythema. No pallor. NEUROLGIC: Alert and oriented to person,  place, and time. PSYCHIATRIC: Normal mood and affect. Normal behavior. Normal judgment and thought content. CARDIOVASCULAR:Not Examined RESPIRATORY: Not Examined BREASTS: Not Examined ABDOMEN: Soft, non distended; Non tender.  No Organomegaly. PELVIC:  External Genitalia: right lower labia, several blisters noted in various stages of healing.   BUS: Normal  Vagina: Normal, white discharge , no odor. Swab collected  Cervix: absent , white discharge   Uterus: absent   MUSCULOSKELETAL: Normal range of motion. No tenderness.  No cyanosis, clubbing, or edema.     Assessment:   Vaginal burning Vaginal lesions   Plan:  She denies new partner and states that she is not concerned that her partner has been unfaithful to her. Discussed that HSV can be dormant and episodes can occur with stress or illness. She verbalizes understanding.  Vaginal swab and blood work completed. Orders placed for valtrex  to [facility] [address] per pt request. Discussed the different in IgM and IgG testing. [facility] does not have IgM testing available. Recommend repeat testing should initial blood work be negative. She verbalizes understanding and agrees.   [name], CNM      [1]  Current Outpatient Medications on File Prior to Visit  Medication Sig Dispense Refill   estradiol  (ESTRACE ) 1 MG tablet Take 1 tablet (1 mg total) by mouth daily. 30 tablet 11   RINVOQ 15 MG TB24 Take 1 tablet by mouth daily.     zolpidem  (AMBIEN ) 10 MG tablet Take 1 tablet (10 mg total) by mouth at bedtime as needed for sleep. 15 tablet 5   Current Facility-Administered Medications on File Prior to Visit  Medication Dose Route Frequency Provider Last Rate Last Admin   dexamethasone  (DECADRON ) injection 2 mg  2 mg Other Once McDonald, Juliene SAUNDERS, DPM       triamcinolone  acetonide (KENALOG -40) injection 10 mg  10 mg Other Once McDonald, Adam R, DPM      [2]  Allergies Allergen Reactions   Adhesive [Tape] Other (See  Comments)    Turns red, skin raises up

## 2024-09-12 ENCOUNTER — Encounter: Payer: Self-pay | Admitting: Certified Nurse Midwife

## 2024-09-12 LAB — HSV 1 AND 2 AB, IGG
HSV 1 Glycoprotein G Ab, IgG: REACTIVE — AB
HSV 2 IgG, Type Spec: REACTIVE — AB

## 2024-09-13 LAB — CERVICOVAGINAL ANCILLARY ONLY
Bacterial Vaginitis (gardnerella): NEGATIVE
Candida Glabrata: NEGATIVE
Candida Vaginitis: NEGATIVE
Chlamydia: NEGATIVE
Comment: NEGATIVE
Comment: NEGATIVE
Comment: NEGATIVE
Comment: NEGATIVE
Comment: NEGATIVE
Comment: NORMAL
Neisseria Gonorrhea: NEGATIVE
Trichomonas: NEGATIVE

## 2024-09-28 ENCOUNTER — Ambulatory Visit: Admitting: Certified Nurse Midwife

## 2024-09-28 ENCOUNTER — Encounter: Payer: Self-pay | Admitting: Certified Nurse Midwife

## 2024-09-28 VITALS — BP 140/82 | HR 75 | Ht 63.0 in | Wt 149.1 lb

## 2024-09-28 DIAGNOSIS — Z8619 Personal history of other infectious and parasitic diseases: Secondary | ICD-10-CM | POA: Insufficient documentation

## 2024-09-28 DIAGNOSIS — Z01419 Encounter for gynecological examination (general) (routine) without abnormal findings: Secondary | ICD-10-CM

## 2024-09-28 DIAGNOSIS — Z1272 Encounter for screening for malignant neoplasm of vagina: Secondary | ICD-10-CM

## 2024-09-28 MED ORDER — ESTRADIOL 1 MG PO TABS: 1.0000 mg | ORAL_TABLET | Freq: Every day | ORAL | 11 refills | Status: AC

## 2024-09-28 MED ORDER — ZOLPIDEM TARTRATE 10 MG PO TABS: 10.0000 mg | ORAL_TABLET | Freq: Every evening | ORAL | 5 refills | Status: AC | PRN

## 2024-09-28 NOTE — Progress Notes (Addendum)
 "        GYNECOLOGY ANNUAL PREVENTATIVE CARE ENCOUNTER NOTE  History:     Danielle Greene is a 54 y.o. G78P1001 female here for a routine annual gynecologic exam.  Current complaints: none.   Denies abnormal vaginal bleeding, discharge, pelvic pain, problems with intercourse or other gynecologic concerns.     Social Relationship: married Living:husband Work:full time radiographer, therapeutic Exercise:not active Smoke/Alcohol/drug use:No/ Occasional/No  Gynecologic History Patient's last menstrual period was 10/24/2013. Contraception: none, hysterectomy Last Pap: 08/27/21. Results were: normal with negative HPV Last mammogram: 08/17/24. Results were: normal  Obstetric History OB History  Gravida Para Term Preterm AB Living  1 1 1   1   SAB IAB Ectopic Multiple Live Births      1    # Outcome Date GA Lbr Len/2nd Weight Sex Type Anes PTL Lv  1 Term 2000    M Vag-Spont   LIV    Past Medical History:  Diagnosis Date   Insomnia   .  HSV   Past Surgical History:  Procedure Laterality Date   ABDOMINAL HYSTERECTOMY     BREAST EXCISIONAL BIOPSY Right 2010   neg   LEG SURGERY Right    COMPARTMENT SYNDROME   PUBOVAGINAL SLING N/A 08/09/2017   Procedure: URETHRAL SLING-TOT;  Surgeon: Danielle Alm Agent, MD;  Location: [facility] ORS;  Service: Gynecology;  Laterality: N/A;    Medications Ordered Prior to Encounter[1]  Allergies[2]  Social History:  reports that she has never smoked. She has never used smokeless tobacco. She reports current alcohol use. She reports that she does not use drugs.  Family History  Problem Relation Age of Onset   Dementia Mother    Breast cancer Neg Hx    Ovarian cancer Neg Hx     The following portions of the patient's history were reviewed and updated as appropriate: allergies, current medications, past family history, past medical history, past social history, past surgical history and problem list.  Review of Systems Pertinent items noted in HPI  and remainder of comprehensive ROS otherwise negative.  Physical Exam:  BP (!) 149/83   Pulse 75   Ht 5' 3 (1.6 m)   Wt 149 lb 1.6 oz (67.6 kg)   LMP 10/24/2013 Comment: Partial hysterectomy   BMI 26.41 kg/m  CONSTITUTIONAL: Well-developed, well-nourished female in no acute distress.  HENT:  Normocephalic, atraumatic, External right and left ear normal. Oropharynx is clear and moist EYES: Conjunctivae and EOM are normal. Pupils are equal, round, and reactive to light. No scleral icterus.  NECK: Normal range of motion, supple, no masses.  Normal thyroid .  SKIN: Skin is warm and dry. No rash noted. Not diaphoretic. No erythema. No pallor. MUSCULOSKELETAL: Normal range of motion. No tenderness.  No cyanosis, clubbing, or edema.  2+ distal pulses. NEUROLOGIC: Alert and oriented to person, place, and time. Normal reflexes, muscle tone coordination.  PSYCHIATRIC: Normal mood and affect. Normal behavior. Normal judgment and thought content. CARDIOVASCULAR: Normal heart rate noted, regular rhythm RESPIRATORY: Clear to auscultation bilaterally. Effort and breath sounds normal, no problems with respiration noted. BREASTS: Symmetric in size. No masses, tenderness, skin changes, nipple drainage, or lymphadenopathy bilaterally.  ABDOMEN: Soft, no distention noted.  No tenderness, rebound or guarding.  PELVIC: Normal appearing external genitalia and urethral meatus; normal appearing vaginal mucosa and cervix.  No abnormal discharge noted.  Pap smear not needed, no cervix . Uterus absent,  no other palpable masses, no or adnexal tenderness.  Danielle Greene  Assessment and Plan:    Annual Well Women GYN Exam   Pap:Will follow up results of pap smear and manage accordingly. Mammogram : ordered Labs: none Refills: Estradiol , ambien  Referral: Routine preventative health maintenance measures emphasized. Please refer to After Visit Summary for other counseling recommendations.      Danielle Greene, CNM [place]  [facility]  [facility],  [facility]      [1]  Current Outpatient Medications on File Prior to Visit  Medication Sig Dispense Refill   RINVOQ 15 MG TB24 Take 1 tablet by mouth daily.     Current Facility-Administered Medications on File Prior to Visit  Medication Dose Route Frequency Provider Last Rate Last Admin   dexamethasone  (DECADRON ) injection 2 mg  2 mg Other Once McDonald, Adam R, DPM       triamcinolone  acetonide (KENALOG -40) injection 10 mg  10 mg Other Once McDonald, Adam R, DPM      [2]  Allergies Allergen Reactions   Adhesive [Tape] Other (See Comments)    Turns red, skin raises up   "

## 2024-09-28 NOTE — Patient Instructions (Signed)
 Preventive Care 66-53 Years Old, Female Preventive care refers to lifestyle choices and visits with your health care provider that can promote health and wellness. Preventive care visits are also called wellness exams. What can I expect for my preventive care visit? Counseling Your health care provider may ask you questions about your: Medical history, including: Past medical problems. Family medical history. Pregnancy history. Current health, including: Menstrual cycle. Method of birth control. Emotional well-being. Home life and relationship well-being. Sexual activity and sexual health. Lifestyle, including: Alcohol, nicotine or tobacco, and drug use. Access to firearms. Diet, exercise, and sleep habits. Work and work Astronomer. Sunscreen use. Safety issues such as seatbelt and bike helmet use. Physical exam Your health care provider will check your: Height and weight. These may be used to calculate your BMI (body mass index). BMI is a measurement that tells if you are at a healthy weight. Waist circumference. This measures the distance around your waistline. This measurement also tells if you are at a healthy weight and may help predict your risk of certain diseases, such as type 2 diabetes and high blood pressure. Heart rate and blood pressure. Body temperature. Skin for abnormal spots. What immunizations do I need?  Vaccines are usually given at various ages, according to a schedule. Your health care provider will recommend vaccines for you based on your age, medical history, and lifestyle or other factors, such as travel or where you work. What tests do I need? Screening Your health care provider may recommend screening tests for certain conditions. This may include: Lipid and cholesterol levels. Diabetes screening. This is done by checking your blood sugar (glucose) after you have not eaten for a while (fasting). Pelvic exam and Pap test. Hepatitis B test. Hepatitis C  test. HIV (human immunodeficiency virus) test. STI (sexually transmitted infection) testing, if you are at risk. Lung cancer screening. Colorectal cancer screening. Mammogram. Talk with your health care provider about when you should start having regular mammograms. This may depend on whether you have a family history of breast cancer. BRCA-related cancer screening. This may be done if you have a family history of breast, ovarian, tubal, or peritoneal cancers. Bone density scan. This is done to screen for osteoporosis. Talk with your health care provider about your test results, treatment options, and if necessary, the need for more tests. Follow these instructions at home: Eating and drinking  Eat a diet that includes fresh fruits and vegetables, whole grains, lean protein, and low-fat dairy products. Take vitamin and mineral supplements as recommended by your health care provider. Do not drink alcohol if: Your health care provider tells you not to drink. You are pregnant, may be pregnant, or are planning to become pregnant. If you drink alcohol: Limit how much you have to 0-1 drink a day. Know how much alcohol is in your drink. In the [place] one drink equals one 12 oz bottle of beer (355 mL), one 5 oz glass of wine (148 mL), or one 1 oz glass of hard liquor (44 mL). Lifestyle Brush your teeth every morning and night with fluoride toothpaste. Floss one time each day. Exercise for at least 30 minutes 5 or more days each week. Do not use any products that contain nicotine or tobacco. These products include cigarettes, chewing tobacco, and vaping devices, such as e-cigarettes. If you need help quitting, ask your health care provider. Do not use drugs. If you are sexually active, practice safe sex. Use a condom or other form of protection to  prevent STIs. If you do not wish to become pregnant, use a form of birth control. If you plan to become pregnant, see your health care provider for a  prepregnancy visit. Take aspirin only as told by your health care provider. Make sure that you understand how much to take and what form to take. Work with your health care provider to find out whether it is safe and beneficial for you to take aspirin daily. Find healthy ways to manage stress, such as: Meditation, yoga, or listening to music. Journaling. Talking to a trusted person. Spending time with friends and family. Minimize exposure to UV radiation to reduce your risk of skin cancer. Safety Always wear your seat belt while driving or riding in a vehicle. Do not drive: If you have been drinking alcohol. Do not ride with someone who has been drinking. When you are tired or distracted. While texting. If you have been using any mind-altering substances or drugs. Wear a helmet and other protective equipment during sports activities. If you have firearms in your house, make sure you follow all gun safety procedures. Seek help if you have been physically or sexually abused. What's next? Visit your health care provider once a year for an annual wellness visit. Ask your health care provider how often you should have your eyes and teeth checked. Stay up to date on all vaccines. This information is not intended to replace advice given to you by your health care provider. Make sure you discuss any questions you have with your health care provider. Document Revised: 02/12/2021 Document Reviewed: 02/12/2021 Elsevier Patient Education  2024 [employer] How to Do a Breast Self-Exam Doing breast self-exams can help you stay healthy. They're one way to know what's normal for your breasts. They can help you catch a problem while it's still small and can be treated. You need to: Check your breasts often. Tell your doctor about any changes. You should do breast self-exams even if you have breast implants. What you need: A mirror. A well-lit room. A pillow or other soft object. How to do a  breast self-exam Look for changes  Take off all the clothes above your waist. Stand in front of a mirror in a room with good lighting. Put your hands down at your sides. Compare your breasts in the mirror. Look for difference between them, such as: Differences in shape. Differences in size. Wrinkles, dips, and bumps in one breast and not the other. Look at each breast for skin changes, such as: Redness. Scaly spots. Spots where your skin is thicker. Dimpling. Open sores. Look for changes in your nipples, such as: Fluid coming out of a nipple. Fluid around a nipple. Bleeding. Dimpling. Redness. A nipple that looks pushed in or that has changed position. Feel for changes Lie on your back. Feel each breast. To do this: Pick a breast to feel. Place a pillow under the shoulder closest to that breast. Put the arm closest to that breast behind your head. Feel the breast using the hand of your other arm. Use the pads of your three middle fingers to make small circles starting near the nipple. Use light, medium, and firm pressure. Keep making circles, moving down over the breast. Stop when you feel your ribs. Start making circles with your fingers again, this time going up until you reach your collarbone. Then, make circles out across your breast and into your armpit area. Squeeze your nipple. Check for fluid and lumps. Do these steps again  to check your other breast. Sit or stand in the tub or shower. With soapy water on your skin, feel each breast the same way you did when you were lying down. Write down what you find Writing down what you find can help you keep track of what you want to tell your doctor. Write down: What's normal for each breast. Any changes you find. Write down: The kind of change. If your breast feels tender or painful. Any lump you find. Write down its size and where it is. When you last had your period. General tips If you're breastfeeding, the best time  to check your breasts is after you feed your baby or after you use a breast pump. If you get a period, the best time to check your breasts is 5-7 days after your period ends. With time, you'll get more used to doing the self-exam. You'll also start to know if there are changes in your breasts. Contact a doctor if: You see a change in the shape or size of your breasts or nipples. You see a change in the skin of your breast or nipples. You have fluid coming from your nipples that isn't normal. You find a new lump or thick area. You have breast pain. You have any concerns about your breast health. This information is not intended to replace advice given to you by your health care provider. Make sure you discuss any questions you have with your health care provider. Document Revised: 10/27/2023 Document Reviewed: 10/27/2023 Elsevier Patient Education  2025 [employer].

## 2024-09-28 NOTE — Addendum Note (Signed)
 Addended by: DELANA SAUER on: 09/28/2024 11:34 AM   Modules accepted: Orders

## 2024-10-03 ENCOUNTER — Ambulatory Visit: Payer: Self-pay | Admitting: Certified Nurse Midwife

## 2024-10-03 LAB — CYTOLOGY - PAP: Adequacy: ABNORMAL

## 2024-10-05 ENCOUNTER — Other Ambulatory Visit: Payer: Self-pay | Admitting: Certified Nurse Midwife

## 2024-10-05 ENCOUNTER — Telehealth: Payer: Self-pay

## 2024-10-05 DIAGNOSIS — Z8619 Personal history of other infectious and parasitic diseases: Secondary | ICD-10-CM

## 2024-10-05 MED ORDER — VALACYCLOVIR HCL 1 G PO TABS: 1000.0000 mg | ORAL_TABLET | Freq: Every day | ORAL | 11 refills | Status: AC

## 2024-10-05 NOTE — Telephone Encounter (Signed)
 Patient called triage line to report continued discomfort after having her first genital HSV outbreak.  She says she completed a 7 day course of Valacyclovir  1/12 but continued to have symptoms so she did a second 7 day course that she finished 1/25.  She was seen at her annual 1/29 and told everything looked cleared up, but she says the discomfort is coming back.  She says she feels inflamed and irritated just like she did right before she started noticing the sores last time.  Visually, she says everything looks fine, but she is afraid she's having another outbreak.  She says it is worse during the day when she is up and about.  She wonders if there is a stronger or longer acting dosage she could try instead.  Message routed to [facility] CNM.

## 2024-10-05 NOTE — Telephone Encounter (Signed)
 Rx sent to [facility] in [place] per pt request.

## 2024-10-12 ENCOUNTER — Ambulatory Visit: Admitting: Nurse Practitioner
